# Patient Record
Sex: Male | Born: 1935 | Race: White | Hispanic: No | Marital: Married | Smoking: Former smoker
Health system: Southern US, Community
[De-identification: ages and names within clinical notes are randomized; demographics above are authoritative.]

## PROBLEM LIST (undated history)

## (undated) DIAGNOSIS — J8410 Pulmonary fibrosis, unspecified: Secondary | ICD-10-CM

## (undated) HISTORY — PX: CHOLECYSTECTOMY: SHX55

---

## 2004-08-26 ENCOUNTER — Other Ambulatory Visit: Payer: Self-pay

## 2004-08-26 ENCOUNTER — Ambulatory Visit: Payer: Self-pay | Admitting: Specialist

## 2005-05-05 ENCOUNTER — Ambulatory Visit: Payer: Self-pay | Admitting: Specialist

## 2005-05-05 ENCOUNTER — Inpatient Hospital Stay: Payer: Self-pay | Admitting: Surgery

## 2005-05-07 ENCOUNTER — Other Ambulatory Visit: Payer: Self-pay

## 2011-07-19 ENCOUNTER — Ambulatory Visit: Payer: Self-pay | Admitting: Specialist

## 2012-04-12 ENCOUNTER — Ambulatory Visit: Payer: Self-pay | Admitting: Specialist

## 2015-02-03 ENCOUNTER — Other Ambulatory Visit: Payer: Self-pay | Admitting: Otolaryngology

## 2015-02-03 DIAGNOSIS — R1310 Dysphagia, unspecified: Secondary | ICD-10-CM

## 2015-02-06 ENCOUNTER — Ambulatory Visit
Admission: RE | Admit: 2015-02-06 | Discharge: 2015-02-06 | Disposition: A | Discharge status: Home / Self Care | Payer: Medicare Other | Source: Ambulatory Visit | Attending: Otolaryngology | Admitting: Otolaryngology

## 2015-02-06 DIAGNOSIS — R1310 Dysphagia, unspecified: Secondary | ICD-10-CM

## 2015-02-06 NOTE — Therapy (Signed)
[facility] DIAGNOSTIC RADIOLOGY [address] [place], [place], [address] Phone: [phone]   Fax:     Modified Barium Swallow  Patient Details  Name: Johnny [name] MRN: [id number] Date of Birth: [birth year] Referring Provider:  Bud Face, MD  Encounter Date: 02/06/2015   Subjective: Patient behavior: (alertness, ability to follow instructions, etc.): alert; conversive w/ SLP though severely [employer] and this may be impeding his verbal communication/interaction w/ others. Wife stated pt may have some Cognitive change/decline as well. He followed general instruction w/ min. Verbal cues.  Chief complaint: pt c/o difficulty when eating some foods; he described "particles in my throat that feel like they get stuck" but that he is able to clear this "feeling" when he alternates w/ another food/liquid. Wife described s/s of Reflux and stated that pt has been prescribed a PPI to take at night(he takes this inconsistently per wife). Wife stated the [facility] is elevated at home and that pt does not eat b/f going to bed.    Objective:  Radiological Procedure: A videoflouroscopic evaluation of oral-preparatory, reflex initiation, and pharyngeal phases of the swallow was performed; as well as a screening of the upper esophageal phase.  I. POSTURE: upright II. VIEW: lateral III. COMPENSATORY STRATEGIES: f/u, dry swallow(independent) IV. BOLUSES ADMINISTERED:  Thin Liquid: 4 via straw  Nectar-thick Liquid: 1 via straw  Honey-thick Liquid: NT  Puree: 3 trials  Mechanical Soft: 2 trials V. RESULTS OF EVALUATION: A. ORAL PREPARATORY PHASE: (The lips, tongue, and velum are observed for strength and coordination)       **Overall Severity Rating: WFL. Adequate mastication and oral clearing noted; timely A-P transfer w/ all bolus consistencies.   B. SWALLOW INITIATION/REFLEX: (The reflex is normal if "triggered" by the time the bolus reached the base of the  tongue)  **Overall Severity Rating: [facility]. Timely pharyngeal swallow initiation w/ all bolus consistencies; no laryngeal penetration or aspiration noted.   C. PHARYNGEAL PHASE: (Pharyngeal function is normal if the bolus shows rapid, smooth, and continuous transit through the pharynx and there is no pharyngeal residue after the swallow)  **Overall Severity Rating: grossly WFL. Trace-min. pharyngeal residue noted inconsistently b/t trial consistencies; pt independently used a f/u, dry swallow which appeared to clear any pharyngeal residue.   D. LARYNGEAL PENETRATION: (Material entering into the laryngeal inlet/vestibule but not aspirated): None E. ASPIRATION: None F. ESOPHAGEAL PHASE: (Screening of the upper esophagus): Noted slower bolus motility w/ trial consistencies in the lower cervical-mid. Esophagus, then lower as Radiologist scanned lower. Radiologist suggested possible Presbyesophagus. This could be related to pt's and wife's description of his complaints at home and his s/s of Reflux. The Esophageal residue appeared to clear w/ time b/t trials.    ASSESSMENT: Pt appeared to present w/ a grossly WFL oropharyngeal swallow function. Pt exhibited a timely pharyngeal swallow initiation w/ all boluses consistencies and no laryngeal penetration or aspiration noted. Pt exhibited an appropriate oral phase w/ timely A-P transfer and swallow/clearing. During the pharyngeal phase, he exhibited trace-min. pharyngeal residue intermittently b/t trial consistencies, however, this did not increase or build up as trials continued. Pt independently swallowed w/ a f/u, dry swallow b/t trials which appeared to clear any pharyngeal residue. Suspect this trace-min. pharyngeal residue could be related to the Esophageal phase dysmotility noted during this study. Slower bolus motility w/ trial consistencies in the lower cervical-mid. Esophagus, then lower as Radiologist scanned lower. Radiologist suggested possible  Presbyesophagus. This could be related to pt's and wife's  description of his complaints at home and his s/s of Reflux. The Esophageal residue appeared to clear w/ time b/t trials.  PLAN/RECOMMENDATIONS:  A. Diet: mech soft-regular(meats cut small; moistened foods)  B. Swallowing Precautions: general aspiration precautions; Reflux precautions  C. Recommended consultation to GI for continued f/u and education of Reflux/PPI rec'd/Esophageal dysmotility  D. Therapy recommendations: none at this time  E. Results and recommendations were discussed w/ pt and wife; video reviewed and instructions/education given on precautions.        End of Session - 16-Feb-2015 1436    Visit Number 1   Number of Visits 1   Date for SLP Re-Evaluation 02/16/15   SLP Start Time 1300   SLP Stop Time  1400   SLP Time Calculation (min) 60 min   Activity Tolerance Patient tolerated treatment well      No past medical history on file.  No past surgical history on file.  There were no vitals filed for this visit.  Visit Diagnosis: Dysphagia - Plan: DG OP Swallowing Func-Medicare/Speech Path, DG OP Swallowing Func-Medicare/Speech Path                               G-Codes - February 16, 2015 1450    Functional Assessment Tool Used clinical judgement   Functional Limitations Swallowing   Swallow Current Status (Z6109) At least 1 percent but less than 20 percent impaired, limited or restricted   Swallow Goal Status (U0454) At least 1 percent but less than 20 percent impaired, limited or restricted   Swallow Discharge Status [phone]) At least 1 percent but less than 20 percent impaired, limited or restricted          Problem List There are no active problems to display for this patient.  Jerilynn Som, MS, CCC-SLP Rosario Duey 02/16/2015, 2:51 PM  [facility] DIAGNOSTIC RADIOLOGY [address] [place], [place], [address] Phone: [phone]    Fax:

## 2016-01-24 ENCOUNTER — Emergency Department: Payer: Medicare Other

## 2016-01-24 ENCOUNTER — Emergency Department
Admission: EM | Admit: 2016-01-24 | Discharge: 2016-01-24 | Disposition: A | Discharge status: Home / Self Care | Payer: Medicare Other | Source: Home / Self Care | Attending: Emergency Medicine | Admitting: Emergency Medicine

## 2016-01-24 ENCOUNTER — Encounter: Payer: Self-pay | Admitting: Emergency Medicine

## 2016-01-24 DIAGNOSIS — J679 Hypersensitivity pneumonitis due to unspecified organic dust: Secondary | ICD-10-CM | POA: Diagnosis present

## 2016-01-24 DIAGNOSIS — J441 Chronic obstructive pulmonary disease with (acute) exacerbation: Secondary | ICD-10-CM | POA: Diagnosis present

## 2016-01-24 DIAGNOSIS — A419 Sepsis, unspecified organism: Secondary | ICD-10-CM | POA: Diagnosis not present

## 2016-01-24 DIAGNOSIS — Z66 Do not resuscitate: Secondary | ICD-10-CM | POA: Diagnosis present

## 2016-01-24 DIAGNOSIS — J9611 Chronic respiratory failure with hypoxia: Secondary | ICD-10-CM

## 2016-01-24 DIAGNOSIS — J440 Chronic obstructive pulmonary disease with acute lower respiratory infection: Secondary | ICD-10-CM | POA: Diagnosis present

## 2016-01-24 DIAGNOSIS — S0101XA Laceration without foreign body of scalp, initial encounter: Secondary | ICD-10-CM

## 2016-01-24 DIAGNOSIS — R296 Repeated falls: Secondary | ICD-10-CM | POA: Diagnosis present

## 2016-01-24 DIAGNOSIS — Z888 Allergy status to other drugs, medicaments and biological substances status: Secondary | ICD-10-CM | POA: Diagnosis not present

## 2016-01-24 DIAGNOSIS — J209 Acute bronchitis, unspecified: Secondary | ICD-10-CM | POA: Diagnosis present

## 2016-01-24 DIAGNOSIS — I248 Other forms of acute ischemic heart disease: Secondary | ICD-10-CM | POA: Diagnosis present

## 2016-01-24 DIAGNOSIS — J9621 Acute and chronic respiratory failure with hypoxia: Secondary | ICD-10-CM | POA: Diagnosis present

## 2016-01-24 DIAGNOSIS — K279 Peptic ulcer, site unspecified, unspecified as acute or chronic, without hemorrhage or perforation: Secondary | ICD-10-CM | POA: Diagnosis present

## 2016-01-24 DIAGNOSIS — Z9049 Acquired absence of other specified parts of digestive tract: Secondary | ICD-10-CM | POA: Diagnosis not present

## 2016-01-24 DIAGNOSIS — Z7951 Long term (current) use of inhaled steroids: Secondary | ICD-10-CM | POA: Diagnosis not present

## 2016-01-24 DIAGNOSIS — E872 Acidosis: Secondary | ICD-10-CM | POA: Diagnosis present

## 2016-01-24 DIAGNOSIS — D696 Thrombocytopenia, unspecified: Secondary | ICD-10-CM | POA: Diagnosis present

## 2016-01-24 DIAGNOSIS — S0990XA Unspecified injury of head, initial encounter: Secondary | ICD-10-CM

## 2016-01-24 DIAGNOSIS — N179 Acute kidney failure, unspecified: Secondary | ICD-10-CM | POA: Diagnosis present

## 2016-01-24 DIAGNOSIS — Z87891 Personal history of nicotine dependence: Secondary | ICD-10-CM | POA: Diagnosis not present

## 2016-01-24 DIAGNOSIS — N183 Chronic kidney disease, stage 3 (moderate): Secondary | ICD-10-CM | POA: Diagnosis present

## 2016-01-24 HISTORY — DX: Pulmonary fibrosis, unspecified: J84.10

## 2016-01-24 LAB — CBC WITH DIFFERENTIAL/PLATELET
Basophils Absolute: 0 10*3/uL (ref 0–0.1)
Eosinophils Absolute: 0.1 10*3/uL (ref 0–0.7)
HEMATOCRIT: 39.9 % — AB (ref 40.0–52.0)
Hemoglobin: 13.6 g/dL (ref 13.0–18.0)
Lymphs Abs: 1.5 10*3/uL (ref 1.0–3.6)
MCH: 29.3 pg (ref 26.0–34.0)
MCHC: 34 g/dL (ref 32.0–36.0)
MCV: 86.2 fL (ref 80.0–100.0)
MONO ABS: 1.2 10*3/uL — AB (ref 0.2–1.0)
NEUTROS ABS: 4.5 10*3/uL (ref 1.4–6.5)
Neutrophils Relative %: 61 %
Platelets: 93 10*3/uL — ABNORMAL LOW (ref 150–440)
RBC: 4.63 MIL/uL (ref 4.40–5.90)
RDW: 13.4 % (ref 11.5–14.5)
WBC: 7.3 10*3/uL (ref 3.8–10.6)

## 2016-01-24 LAB — BASIC METABOLIC PANEL
ANION GAP: 10 (ref 5–15)
BUN: 30 mg/dL — AB (ref 6–20)
CALCIUM: 9.6 mg/dL (ref 8.9–10.3)
CO2: 19 mmol/L — AB (ref 22–32)
Chloride: 110 mmol/L (ref 101–111)
Creatinine, Ser: 1.56 mg/dL — ABNORMAL HIGH (ref 0.61–1.24)
GFR calc Af Amer: 43 mL/min — ABNORMAL LOW (ref >60)
GFR calc non Af Amer: 37 mL/min — ABNORMAL LOW (ref >60)
GLUCOSE: 127 mg/dL — AB (ref 65–99)
Potassium: 3.7 mmol/L (ref 3.5–5.1)
Sodium: 139 mmol/L (ref 135–145)

## 2016-01-24 MED ORDER — TETANUS-DIPHTH-ACELL PERTUSSIS 5-2.5-18.5 LF-MCG/0.5 IM SUSP
0.5000 mL | Freq: Once | INTRAMUSCULAR | Status: AC
Start: 2016-01-24 — End: 2016-01-24
  Administered 2016-01-24: 0.5 mL via INTRAMUSCULAR
  Filled 2016-01-24: qty 0.5

## 2016-01-24 MED ORDER — PREDNISONE 20 MG PO TABS: 20.0000 mg | ORAL_TABLET | Freq: Two times a day (BID) | ORAL | Status: AC

## 2016-01-24 MED ORDER — LIDOCAINE-EPINEPHRINE (PF) 1 %-1:200000 IJ SOLN
30.0000 mL | Freq: Once | INTRAMUSCULAR | Status: AC
  Administered 2016-01-24: 30 mL via INTRADERMAL
  Filled 2016-01-24: qty 30

## 2016-01-24 MED ORDER — AZITHROMYCIN 250 MG PO TABS: ORAL_TABLET | ORAL | Status: AC

## 2016-01-24 NOTE — ED Provider Notes (Signed)
[facility] Emergency Department Provider Note  ____________________________________________  Time seen: 11:45 AM  I have reviewed the triage vital signs and the nursing notes.   HISTORY  Chief Complaint Fall    HPI Johnny [name] is a 82 y.o. male brought to the ED due to fall in the bathroom with head injury and bleeding. Patient has a history of pulmonary fibrosis and is supposed to wear 2 L nasal cannula oxygen at all times. However, the patient is very resistant to using it. Today he was walking to the bathroom and he reports that he had dizzy and fell back. He hit his head sustaining a laceration. At home he is reported to have an oxygen saturation of 77%, but this appears to be on room air. He denies any productive cough or fever. No chest pain. Patient denies shortness of breath. Only reports headache in the injured area. No numbness tingling weakness or neck pain.   Past Medical History  Diagnosis Date  . Pulmonary fibrosis (HCC)      There are no active problems to display for this patient.    Past Surgical History  Procedure Laterality Date  . Cholecystectomy       Current Outpatient Rx  Name  Route  Sig  Dispense  Refill  . azithromycin (ZITHROMAX Z-PAK) 250 MG tablet      Take 2 tablets (500 mg) on  Day 1,  followed by 1 tablet (250 mg) once daily on Days 2 through 5.   6 each   0   . predniSONE (DELTASONE) 20 MG tablet   Oral   Take 1 tablet (20 mg total) by mouth 2 (two) times daily with a meal.   8 tablet   0      Allergies Review of patient's allergies indicates no known allergies.   No family history on file.  Social History Social History  Substance Use Topics  . Smoking status: Former Smoker    Types: Cigarettes  . Smokeless tobacco: None  . Alcohol Use: No    Review of Systems  Constitutional:   No fever or chills.  ENT:   No sore throat. No rhinorrhea. Cardiovascular:   No chest pain. Respiratory:    Chronic respiratory failure.. Gastrointestinal:   Negative for abdominal pain, vomiting and diarrhea.  Genitourinary:   Negative for dysuria or difficulty urinating. Musculoskeletal:   Negative for focal pain or swelling Neurological:   Positive posterior headache from trauma 10-point ROS otherwise negative.  ____________________________________________   PHYSICAL EXAM:  VITAL SIGNS: ED Triage Vitals  Enc Vitals Group     BP 01/24/16 1140 96/50 mmHg     Pulse Rate 01/24/16 1140 83     Resp 01/24/16 1138 16     Temp 01/24/16 1138 98.2 F (36.8 C)     Temp Source 01/24/16 1138 Oral     SpO2 01/24/16 1140 80 %     Weight --      Height --      Head Cir --      Peak Flow --      Pain Score --      Pain Loc --      Pain Edu? --      Excl. in GC? --     Vital signs reviewed, nursing assessments reviewed.   Constitutional:   Alert and oriented. Well appearing and in no distress. Eyes:   No scleral icterus. No conjunctival pallor. PERRL. EOMI.  No nystagmus.  ENT   Head:   Normocephalic 5 cm linear laceration on the right posterior parietal scalp area hemostatic.Marland Kitchen   Nose:   No congestion/rhinnorhea. No septal hematoma   Mouth/Throat:   MMM, no pharyngeal erythema. No peritonsillar mass.    Neck:   No stridor. No SubQ emphysema. No meningismus. Hematological/Lymphatic/Immunilogical:   No cervical lymphadenopathy. Cardiovascular:   RRR. Symmetric bilateral radial and DP pulses.  No murmurs.  Respiratory:   Normal respiratory effort without tachypnea nor retractions. Diffuse Velcro like crackles. Saturation 92% on 2 L nasal cannula. Gastrointestinal:   Soft and nontender. Non distended. There is no CVA tenderness.  No rebound, rigidity, or guarding. Genitourinary:   deferred Musculoskeletal:   Nontender with normal range of motion in all extremities. No joint effusions.  No lower extremity tenderness.  No edema. Neurologic:   Normal speech and language.  CN 2-10  normal. Motor grossly intact. No gross focal neurologic deficits are appreciated.  Skin:    Skin is warm, dry and intact. No rash noted.  No petechiae, purpura, or bullae. Laceration as above  ____________________________________________    LABS (pertinent positives/negatives) (all labs ordered are listed, but only abnormal results are displayed) Labs Reviewed  BASIC METABOLIC PANEL - Abnormal; Notable for the following:    CO2 19 (*)    Glucose, Bld 127 (*)    BUN 30 (*)    Creatinine, Ser 1.56 (*)    GFR calc non Af Amer 37 (*)    GFR calc Af Amer 43 (*)    All other components within normal limits  CBC WITH DIFFERENTIAL/PLATELET - Abnormal; Notable for the following:    HCT 39.9 (*)    Platelets 93 (*)    Monocytes Absolute 1.2 (*)    All other components within normal limits   ____________________________________________   EKG  Interpreted by me Sinus rhythm rate of 81, normal axis and intervals. Normal QRS ST segments and T waves. Voltage criteria for LVH in the high lateral leads.  ____________________________________________    RADIOLOGY  Chest x-ray shows chronic disease consistent with pulmonary fibrosis, no acute infiltrate. CT head unremarkable CT cervical spine unremarkable  ____________________________________________   PROCEDURES LACERATION REPAIR Performed by: Scotty Court, Seylah Wernert Authorized by: Sharman Cheek Consent: Verbal consent obtained. Risks and benefits: risks, benefits and alternatives were discussed Consent given by: patient Patient identity confirmed: provided demographic data Prepped and Draped in normal sterile fashion Wound explored  Laceration Location: Right posterior parietal scalp  Laceration Length: 5cm  No Foreign Bodies seen or palpated  Anesthesia: local infiltration  Local anesthetic: lidocaine 1% with epinephrine  Anesthetic total: 2 ml  Irrigation method: syringe Amount of cleaning: standard  Skin closure:  Staples   Number of sutures: 4  Technique: Staple gun   Patient tolerance: Patient tolerated the procedure well with no immediate complications.   ____________________________________________   INITIAL IMPRESSION / ASSESSMENT AND PLAN / ED COURSE  Pertinent labs & imaging results that were available during my care of the patient were reviewed by me and considered in my medical decision making (see chart for details).  Patient well appearing no acute distress. Labs at baseline, baseline creatinine is 1.9 according to electronic medical record. Vital signs unremarkable. On reassessment, patient is breathing very comfortably and oxygen saturation is 96-97% on his baseline 2 L nasal cannula. Wound was repaired after being cleansed. Imaging negative. All started patient on steroids and azithromycin and encouraged close follow-up with primary care. Return precautions given. No evidence of  pneumothorax or acute pneumonia. No sepsis. Suitable for outpatient follow-up.     ____________________________________________   FINAL CLINICAL IMPRESSION(S) / ED DIAGNOSES  Final diagnoses:  Scalp laceration, initial encounter  Head trauma, initial encounter  Chronic respiratory failure with hypoxia (HCC)       Portions of this note were generated with dragon dictation software. Dictation errors may occur despite best attempts at proofreading.   Sharman Cheek, MD 01/24/16 [phone]

## 2016-01-24 NOTE — Discharge Instructions (Signed)
Staples should be removed in 1 week.    Concussion, Adult A concussion, or closed-head injury, is a brain injury caused by a direct blow to the head or by a quick and sudden movement (jolt) of the head or neck. Concussions are usually not life-threatening. Even so, the effects of a concussion can be serious. If you have had a concussion before, you are more likely to experience concussion-like symptoms after a direct blow to the head.  CAUSES  Direct blow to the head, such as from running into another player during a soccer game, being hit in a fight, or hitting your head on a hard surface.  A jolt of the head or neck that causes the brain to move back and forth inside the skull, such as in a car crash. SIGNS AND SYMPTOMS The signs of a concussion can be hard to notice. Early on, they may be missed by you, family members, and health care providers. You may look fine but act or feel differently. Symptoms are usually temporary, but they may last for days, weeks, or even longer. Some symptoms may appear right away while others may not show up for hours or days. Every head injury is different. Symptoms include:  Mild to moderate headaches that will not go away.  A feeling of pressure inside your head.  Having more trouble than usual:  Learning or remembering things you have heard.  Answering questions.  Paying attention or concentrating.  Organizing daily tasks.  Making decisions and solving problems.  Slowness in thinking, acting or reacting, speaking, or reading.  Getting lost or being easily confused.  Feeling tired all the time or lacking energy (fatigued).  Feeling drowsy.  Sleep disturbances.  Sleeping more than usual.  Sleeping less than usual.  Trouble falling asleep.  Trouble sleeping (insomnia).  Loss of balance or feeling lightheaded or dizzy.  Nausea or vomiting.  Numbness or tingling.  Increased sensitivity  to:  Sounds.  Lights.  Distractions.  Vision problems or eyes that tire easily.  Diminished sense of taste or smell.  Ringing in the ears.  Mood changes such as feeling sad or anxious.  Becoming easily irritated or angry for little or no reason.  Lack of motivation.  Seeing or hearing things other people do not see or hear (hallucinations). DIAGNOSIS Your health care provider can usually diagnose a concussion based on a description of your injury and symptoms. He or she will ask whether you passed out (lost consciousness) and whether you are having trouble remembering events that happened right before and during your injury. Your evaluation might include:  A brain scan to look for signs of injury to the brain. Even if the test shows no injury, you may still have a concussion.  Blood tests to be sure other problems are not present. TREATMENT  Concussions are usually treated in an emergency department, in urgent care, or at a clinic. You may need to stay in the hospital overnight for further treatment.  Tell your health care provider if you are taking any medicines, including prescription medicines, over-the-counter medicines, and natural remedies. Some medicines, such as blood thinners (anticoagulants) and aspirin, may increase the chance of complications. Also tell your health care provider whether you have had alcohol or are taking illegal drugs. This information may affect treatment.  Your health care provider will send you home with important instructions to follow.  How fast you will recover from a concussion depends on many factors. These factors include how severe  your concussion is, what part of your brain was injured, your age, and how healthy you were before the concussion.  Most people with mild injuries recover fully. Recovery can take time. In general, recovery is slower in older persons. Also, persons who have had a concussion in the past or have other medical  problems may find that it takes longer to recover from their current injury. HOME CARE INSTRUCTIONS General Instructions  Carefully follow the directions your health care provider gave you.  Only take over-the-counter or prescription medicines for pain, discomfort, or fever as directed by your health care provider.  Take only those medicines that your health care provider has approved.  Do not drink alcohol until your health care provider says you are well enough to do so. Alcohol and certain other drugs may slow your recovery and can put you at risk of further injury.  If it is harder than usual to remember things, write them down.  If you are easily distracted, try to do one thing at a time. For example, do not try to watch TV while fixing dinner.  Talk with family members or close friends when making important decisions.  Keep all follow-up appointments. Repeated evaluation of your symptoms is recommended for your recovery.  Watch your symptoms and tell others to do the same. Complications sometimes occur after a concussion. Older adults with a brain injury may have a higher risk of serious complications, such as a blood clot on the brain.  Tell your teachers, school nurse, school counselor, coach, athletic trainer, or work Production designer, theatre/television/filmmanager about your injury, symptoms, and restrictions. Tell them about what you can or cannot do. They should watch for:  Increased problems with attention or concentration.  Increased difficulty remembering or learning new information.  Increased time needed to complete tasks or assignments.  Increased irritability or decreased ability to cope with stress.  Increased symptoms.  Rest. Rest helps the brain to heal. Make sure you:  Get plenty of sleep at night. Avoid staying up late at night.  Keep the same bedtime hours on weekends and weekdays.  Rest during the day. Take daytime naps or rest breaks when you feel tired.  Limit activities that require a  lot of thought or concentration. These include:  Doing homework or job-related work.  Watching TV.  Working on the computer.  Avoid any situation where there is potential for another head injury (football, hockey, soccer, basketball, martial arts, downhill snow sports and horseback riding). Your condition will get worse every time you experience a concussion. You should avoid these activities until you are evaluated by the appropriate follow-up health care providers. Returning To Your Regular Activities You will need to return to your normal activities slowly, not all at once. You must give your body and brain enough time for recovery.  Do not return to sports or other athletic activities until your health care provider tells you it is safe to do so.  Ask your health care provider when you can drive, ride a bicycle, or operate heavy machinery. Your ability to react may be slower after a brain injury. Never do these activities if you are dizzy.  Ask your health care provider about when you can return to work or school. Preventing Another Concussion It is very important to avoid another brain injury, especially before you have recovered. In rare cases, another injury can lead to permanent brain damage, brain swelling, or death. The risk of this is greatest during the first 7-10 days  after a head injury. Avoid injuries by:  Wearing a seat belt when riding in a car.  Drinking alcohol only in moderation.  Wearing a helmet when biking, skiing, skateboarding, skating, or doing similar activities.  Avoiding activities that could lead to a second concussion, such as contact or recreational sports, until your health care provider says it is okay.  Taking safety measures in your home.  Remove clutter and tripping hazards from floors and stairways.  Use grab bars in bathrooms and handrails by stairs.  Place non-slip mats on floors and in bathtubs.  Improve lighting in dim areas. SEEK MEDICAL  CARE IF:  You have increased problems paying attention or concentrating.  You have increased difficulty remembering or learning new information.  You need more time to complete tasks or assignments than before.  You have increased irritability or decreased ability to cope with stress.  You have more symptoms than before. Seek medical care if you have any of the following symptoms for more than 2 weeks after your injury:  Lasting (chronic) headaches.  Dizziness or balance problems.  Nausea.  Vision problems.  Increased sensitivity to noise or light.  Depression or mood swings.  Anxiety or irritability.  Memory problems.  Difficulty concentrating or paying attention.  Sleep problems.  Feeling tired all the time. SEEK IMMEDIATE MEDICAL CARE IF:  You have severe or worsening headaches. These may be a sign of a blood clot in the brain.  You have weakness (even if only in one hand, leg, or part of the face).  You have numbness.  You have decreased coordination.  You vomit repeatedly.  You have increased sleepiness.  One pupil is larger than the other.  You have convulsions.  You have slurred speech.  You have increased confusion. This may be a sign of a blood clot in the brain.  You have increased restlessness, agitation, or irritability.  You are unable to recognize people or places.  You have neck pain.  It is difficult to wake you up.  You have unusual behavior changes.  You lose consciousness. MAKE SURE YOU:  Understand these instructions.  Will watch your condition.  Will get help right away if you are not doing well or get worse.   This information is not intended to replace advice given to you by your health care provider. Make sure you discuss any questions you have with your health care provider.   Document Released: 09/25/2003 Document Revised: 07/26/2014 Document Reviewed: 01/25/2013 Elsevier Interactive Patient Education 2016  [employer]  Chronic Respiratory Failure Respiratory failure is when your lungs are not working well and your breathing (respiratory) system fails. When respiratory failure occurs, it is difficult for your lungs to get enough oxygen or get rid of carbon dioxide or both. Respiratory failure can be life threatening.  Respiratory failure can be acute or chronic. Acute respiratory failure is sudden, severe, and requires emergency medical treatment. Chronic respiratory failure is less severe, happens over time, and requires ongoing treatment.  CAUSES  Any problem affecting the heart or lungs can cause respiratory failure. Some of these causes may be:  Chronic bronchitis and emphysema (COPD).  Blood clot going to the lung (pulmonary embolism).  Having water in the lungs caused by heart failure, lung injury, or infection (pulmonary edema).  Collapsed lung (pneumothorax).  Pneumonia.  Pulmonary fibrosis.  Obesity.  Asthma.  Heart failure.  Any type of trauma to the chest that can make breathing difficult.  Nerve or muscle diseases making  chest movements difficult. SYMPTOMS  Signs and symptoms of chronic respiratory failure include:  Shortness of breath (dyspnea) with or without activity.  Rapid, fast breathing (tachypnea).  Wheezing.  Fast heart rate.  Bluish color to the fingernail or toenail beds.  Confusion or drowsiness or both. DIAGNOSIS  Initial diagnosis requires a thorough history and a physical exam by your health care provider. Additional tests may include:  Chest X-ray.  CT scan of your lungs.  Ultrasound to check for blood clots.  Blood tests, such as an arterial blood gas test (ABG). This is a blood test that looks at the oxygen and carbon dioxide levels in your arterial blood.  Your vital signs will be taken. This includes your respiratory rate (how many times a minute you are breathing), oxygen saturation (this measures the oxygen level in your blood),  heart rate, and blood pressure. These numbers help your health care provider determine the next steps.  Electrocardiogram. TREATMENT  Treatment of chronic respiratory failure depends on the cause of the respiratory failure. Treatment can include the following:  Oxygen. Oxygen can be delivered through the following:  Nasal cannula. This is small tubing that goes in your nose to give you oxygen.  Face mask. A face mask covers your nose and mouth to give you oxygen.  Medicine. Different medicines can be given to help with breathing. These can include:  Nebulizers. Nebulizers deliver medicines to open the air passages (bronchodilators). These medicines help to open or relax the airways in the lungs so you can breathe better. They can also help loosen mucus from your lungs.  Diuretics. Diuretic medicines can help you breathe better by getting rid of extra fluid in your body.  Steroids. Steroid medicines can help decrease inflammation in your lungs.  Chest tube. If you have a collapsed lung (pneumothorax), a chest tube is placed to help reinflate the lung.  Noninvasive positive pressure ventilation (NPPV). This is a tight-fitting mask that goes over your nose and mouth. The mask has tubing that is attached to a machine. The machine blows air into the tubing, which helps to keep the tiny air sacs (alveoli) in your lungs open. This machine allows you to breathe on your own.  Ventilator. A ventilator is a breathing machine. When on a ventilator, a breathing tube is put into the lungs. A ventilator is used when you can no longer breathe well enough on your own. You may have low oxygen levels or high carbon dioxide (CO2) levels in your blood. When you are on a ventilator, sedation and pain medicines are given to make you sleep so your lungs can heal. HOME CARE INSTRUCTIONS  Follow your health care provider's directions about medicines and respiratory therapy.  Quit smoking if you smoke. SEEK  MEDICAL CARE IF:  You have increasing shortness of breath and are less functional than you have been.  You have increased sputum, wheezing, coughing, or loss of energy.  You are on oxygen and are requiring more. SEEK IMMEDIATE MEDICAL CARE IF:  Your shortness of breath is significantly worse.  You are unable to say more than a few words without having to catch your breath.  You are much less functional. MAKE SURE YOU:  Understand these instructions.  Will watch your condition.  Will get help right away if you are not doing well or get worse.   This information is not intended to replace advice given to you by your health care provider. Make sure you discuss any questions you  have with your health care provider.   Document Released: 07/05/2005 Document Revised: 03/26/2015 Document Reviewed: 05/03/2013 Elsevier Interactive Patient Education 2016 [employer].  Stitches, [place], or Adhesive Wound Closure Health care providers use stitches (sutures), staples, and certain glue (skin adhesives) to hold skin together while it heals (wound closure). You may need this treatment after you have surgery or if you cut your skin accidentally. These methods help your skin to heal more quickly and make it less likely that you will have a scar. A wound may take several months to heal completely. The type of wound you have determines when your wound gets closed. In most cases, the wound is closed as soon as possible (primary skin closure). Sometimes, closure is delayed so the wound can be cleaned and allowed to heal naturally. This reduces the chance of infection. Delayed closure may be needed if your wound:  Is caused by a bite.  Happened more than 6 hours ago.  Involves loss of skin or the tissues under the skin.  Has dirt or debris in it that cannot be removed.  Is infected. WHAT ARE THE DIFFERENT KINDS OF WOUND CLOSURES? There are many options for wound closure. The one that your health  care provider uses depends on how deep and how large your wound is. Adhesive Glue To use this type of glue to close a wound, your health care provider holds the edges of the wound together and paints the glue on the surface of your skin. You may need more than one layer of glue. Then the wound may be covered with a light bandage (dressing). This type of skin closure may be used for small wounds that are not deep (superficial). Using glue for wound closure is less painful than other methods. It does not require a medicine that numbs the area (local anesthetic). This method also leaves nothing to be removed. Adhesive glue is often used for children and on facial wounds. Adhesive glue cannot be used for wounds that are deep, uneven, or bleeding. It is not used inside of a wound.  Adhesive Strips These strips are made of sticky (adhesive), porous paper. They are applied across your skin edges like a regular adhesive bandage. You leave them on until they fall off. Adhesive strips may be used to close very superficial wounds. They may also be used along with sutures to improve the closure of your skin edges.  Sutures Sutures are the oldest method of wound closure. Sutures can be made from natural substances, such as silk, or from synthetic materials, such as nylon and steel. They can be made from a material that your body can break down as your wound heals (absorbable), or they can be made from a material that needs to be removed from your skin (nonabsorbable). They come in many different strengths and sizes. Your health care provider attaches the sutures to a steel needle on one end. Sutures can be passed through your skin, or through the tissues beneath your skin. Then they are tied and cut. Your skin edges may be closed in one continuous stitch or in separate stitches. Sutures are strong and can be used for all kinds of wounds. Absorbable sutures may be used to close tissues under the skin. The disadvantage  of sutures is that they may cause skin reactions that lead to infection. Nonabsorbable sutures need to be removed. Staples When surgical staples are used to close a wound, the edges of your skin on both sides of the wound  are brought close together. A staple is placed across the wound, and an instrument secures the edges together. Staples are often used to close surgical cuts (incisions). Staples are faster to use than sutures, and they cause less skin reaction. Staples need to be removed using a tool that bends the staples away from your skin. HOW DO I CARE FOR MY WOUND CLOSURE?  Take medicines only as directed by your health care provider.  If you were prescribed an antibiotic medicine for your wound, finish it all even if you start to feel better.  Use ointments or creams only as directed by your health care provider.  Wash your hands with soap and water before and after touching your wound.  Do not soak your wound in water. Do not take baths, swim, or use a hot tub until your health care provider approves.  Ask your health care provider when you can start showering. Cover your wound if directed by your health care provider.  Do not take out your own sutures or staples.  Do not pick at your wound. Picking can cause an infection.  Keep all follow-up visits as directed by your health care provider. This is important. HOW [place] WILL I HAVE MY WOUND CLOSURE?  Leave adhesive glue on your skin until the glue peels away.  Leave adhesive strips on your skin until the strips fall off.  Absorbable sutures will dissolve within several days.  Nonabsorbable sutures and staples must be removed. The location of the wound will determine how [place] they stay in. This can range from several days to a couple of weeks. WHEN SHOULD I SEEK HELP FOR MY WOUND CLOSURE? Contact your health care provider if:  You have a fever.  You have chills.  You have drainage, redness, swelling, or pain at your  wound.  There is a bad smell coming from your wound.  The skin edges of your wound start to separate after your sutures have been removed.  Your wound becomes thick, raised, and darker in color after your sutures come out (scarring).   This information is not intended to replace advice given to you by your health care provider. Make sure you discuss any questions you have with your health care provider.   Document Released: 03/30/2001 Document Revised: 07/26/2014 Document Reviewed: 12/12/2013 Elsevier Interactive Patient Education [employer]

## 2016-01-24 NOTE — ED Notes (Addendum)
Pt from home via EMS for fall. Pt was in shower and hit back of head, per EMS pt lost approx 50cc of blood, direct pressure was applied and curlex applied. Pt A&O, confused at times but this at baseline. Pt denies any LOC. VS stable.pt hx of pulm fibrosis and wears 2L [place] contin,  Pt is 77% on 2L currently, pt increased to 4L at 90%

## 2016-01-24 NOTE — ED Notes (Signed)
Pt changed and pericare done  and put into blue scrubs for d/c

## 2016-01-25 ENCOUNTER — Inpatient Hospital Stay
Admission: EM | Admit: 2016-01-25 | Discharge: 2016-02-17 | DRG: 871 | Disposition: E | Payer: Medicare Other | Attending: Internal Medicine | Admitting: Internal Medicine

## 2016-01-25 ENCOUNTER — Inpatient Hospital Stay: Payer: Medicare Other

## 2016-01-25 ENCOUNTER — Emergency Department: Payer: Medicare Other

## 2016-01-25 ENCOUNTER — Encounter: Payer: Self-pay | Admitting: Emergency Medicine

## 2016-01-25 DIAGNOSIS — I248 Other forms of acute ischemic heart disease: Secondary | ICD-10-CM | POA: Diagnosis present

## 2016-01-25 DIAGNOSIS — N183 Chronic kidney disease, stage 3 unspecified: Secondary | ICD-10-CM

## 2016-01-25 DIAGNOSIS — J679 Hypersensitivity pneumonitis due to unspecified organic dust: Secondary | ICD-10-CM | POA: Diagnosis present

## 2016-01-25 DIAGNOSIS — Z7951 Long term (current) use of inhaled steroids: Secondary | ICD-10-CM | POA: Diagnosis not present

## 2016-01-25 DIAGNOSIS — Z79899 Other long term (current) drug therapy: Secondary | ICD-10-CM | POA: Diagnosis not present

## 2016-01-25 DIAGNOSIS — Z515 Encounter for palliative care: Secondary | ICD-10-CM

## 2016-01-25 DIAGNOSIS — Z87891 Personal history of nicotine dependence: Secondary | ICD-10-CM | POA: Diagnosis not present

## 2016-01-25 DIAGNOSIS — Z9049 Acquired absence of other specified parts of digestive tract: Secondary | ICD-10-CM | POA: Diagnosis not present

## 2016-01-25 DIAGNOSIS — R531 Weakness: Secondary | ICD-10-CM

## 2016-01-25 DIAGNOSIS — A419 Sepsis, unspecified organism: Principal | ICD-10-CM | POA: Diagnosis present

## 2016-01-25 DIAGNOSIS — K279 Peptic ulcer, site unspecified, unspecified as acute or chronic, without hemorrhage or perforation: Secondary | ICD-10-CM | POA: Diagnosis present

## 2016-01-25 DIAGNOSIS — D696 Thrombocytopenia, unspecified: Secondary | ICD-10-CM | POA: Diagnosis present

## 2016-01-25 DIAGNOSIS — Z888 Allergy status to other drugs, medicaments and biological substances status: Secondary | ICD-10-CM

## 2016-01-25 DIAGNOSIS — Z66 Do not resuscitate: Secondary | ICD-10-CM

## 2016-01-25 DIAGNOSIS — N179 Acute kidney failure, unspecified: Secondary | ICD-10-CM | POA: Diagnosis present

## 2016-01-25 DIAGNOSIS — R509 Fever, unspecified: Secondary | ICD-10-CM

## 2016-01-25 DIAGNOSIS — R451 Restlessness and agitation: Secondary | ICD-10-CM

## 2016-01-25 DIAGNOSIS — R296 Repeated falls: Secondary | ICD-10-CM | POA: Diagnosis present

## 2016-01-25 DIAGNOSIS — J8410 Pulmonary fibrosis, unspecified: Secondary | ICD-10-CM | POA: Diagnosis present

## 2016-01-25 DIAGNOSIS — Z7952 Long term (current) use of systemic steroids: Secondary | ICD-10-CM

## 2016-01-25 DIAGNOSIS — J9600 Acute respiratory failure, unspecified whether with hypoxia or hypercapnia: Secondary | ICD-10-CM

## 2016-01-25 DIAGNOSIS — E872 Acidosis: Secondary | ICD-10-CM | POA: Diagnosis present

## 2016-01-25 DIAGNOSIS — J9621 Acute and chronic respiratory failure with hypoxia: Secondary | ICD-10-CM

## 2016-01-25 DIAGNOSIS — J440 Chronic obstructive pulmonary disease with acute lower respiratory infection: Secondary | ICD-10-CM | POA: Diagnosis present

## 2016-01-25 DIAGNOSIS — J209 Acute bronchitis, unspecified: Secondary | ICD-10-CM | POA: Diagnosis present

## 2016-01-25 DIAGNOSIS — J441 Chronic obstructive pulmonary disease with (acute) exacerbation: Secondary | ICD-10-CM | POA: Diagnosis present

## 2016-01-25 LAB — URINALYSIS COMPLETE WITH MICROSCOPIC (ARMC ONLY)
BACTERIA UA: NONE SEEN
Bilirubin Urine: NEGATIVE
GLUCOSE, UA: NEGATIVE mg/dL
Ketones, ur: NEGATIVE mg/dL
LEUKOCYTES UA: NEGATIVE
Nitrite: NEGATIVE
PH: 5 (ref 5.0–8.0)
Protein, ur: 30 mg/dL — AB
Specific Gravity, Urine: 1.019 (ref 1.005–1.030)

## 2016-01-25 LAB — CBC WITH DIFFERENTIAL/PLATELET
Basophils Absolute: 0 10*3/uL (ref 0–0.1)
EOS ABS: 0 10*3/uL (ref 0–0.7)
Eosinophils Relative: 0 %
HCT: 36.2 % — ABNORMAL LOW (ref 40.0–52.0)
HEMOGLOBIN: 12.5 g/dL — AB (ref 13.0–18.0)
LYMPHS ABS: 0.9 10*3/uL — AB (ref 1.0–3.6)
Lymphocytes Relative: 9 %
MCH: 29.7 pg (ref 26.0–34.0)
MCHC: 34.5 g/dL (ref 32.0–36.0)
MCV: 86.1 fL (ref 80.0–100.0)
Monocytes Absolute: 1.8 10*3/uL — ABNORMAL HIGH (ref 0.2–1.0)
Monocytes Relative: 18 %
NEUTROS ABS: 6.9 10*3/uL — AB (ref 1.4–6.5)
Platelets: 82 10*3/uL — ABNORMAL LOW (ref 150–440)
RBC: 4.21 MIL/uL — AB (ref 4.40–5.90)
RDW: 13.4 % (ref 11.5–14.5)
WBC: 9.7 10*3/uL (ref 3.8–10.6)

## 2016-01-25 LAB — COMPREHENSIVE METABOLIC PANEL
ALBUMIN: 3.3 g/dL — AB (ref 3.5–5.0)
ALK PHOS: 67 U/L (ref 38–126)
ALT: 11 U/L — AB (ref 17–63)
AST: 28 U/L (ref 15–41)
Anion gap: 7 (ref 5–15)
BUN: 30 mg/dL — AB (ref 6–20)
CALCIUM: 9.4 mg/dL (ref 8.9–10.3)
CO2: 20 mmol/L — AB (ref 22–32)
CREATININE: 1.48 mg/dL — AB (ref 0.61–1.24)
Chloride: 112 mmol/L — ABNORMAL HIGH (ref 101–111)
GFR calc Af Amer: 46 mL/min — ABNORMAL LOW (ref >60)
GFR calc non Af Amer: 40 mL/min — ABNORMAL LOW (ref >60)
Glucose, Bld: 115 mg/dL — ABNORMAL HIGH (ref 65–99)
Potassium: 4.1 mmol/L (ref 3.5–5.1)
SODIUM: 139 mmol/L (ref 135–145)
Total Bilirubin: 2.2 mg/dL — ABNORMAL HIGH (ref 0.3–1.2)
Total Protein: 6.8 g/dL (ref 6.5–8.1)

## 2016-01-25 LAB — TSH: TSH: 0.586 u[IU]/mL (ref 0.350–4.500)

## 2016-01-25 LAB — LACTIC ACID, PLASMA
LACTIC ACID, VENOUS: 1.3 mmol/L (ref 0.5–1.9)
LACTIC ACID, VENOUS: 1 mmol/L (ref 0.5–1.9)

## 2016-01-25 LAB — TROPONIN I
TROPONIN I: 0.03 ng/mL — AB (ref <0.03)
TROPONIN I: 0.04 ng/mL — AB (ref <0.03)

## 2016-01-25 LAB — EXPECTORATED SPUTUM ASSESSMENT W GRAM STAIN, RFLX TO RESP C

## 2016-01-25 LAB — EXPECTORATED SPUTUM ASSESSMENT W REFEX TO RESP CULTURE

## 2016-01-25 MED ORDER — SODIUM CHLORIDE 0.9 % IV SOLN
INTRAVENOUS | Status: DC
  Administered 2016-01-25: 16:00:00 via INTRAVENOUS

## 2016-01-25 MED ORDER — FLUOXETINE HCL 20 MG PO CAPS
20.0000 mg | ORAL_CAPSULE | Freq: Every day | ORAL | Status: DC
  Administered 2016-01-25: 18:00:00 20 mg via ORAL
  Filled 2016-01-25 (×2): qty 1

## 2016-01-25 MED ORDER — GUAIFENESIN ER 600 MG PO TB12
600.0000 mg | ORAL_TABLET | Freq: Two times a day (BID) | ORAL | Status: DC
  Administered 2016-01-25 (×2): 600 mg via ORAL
  Filled 2016-01-25 (×3): qty 1

## 2016-01-25 MED ORDER — SODIUM CHLORIDE 3 % IN NEBU
4.0000 mL | INHALATION_SOLUTION | Freq: Every day | RESPIRATORY_TRACT | Status: DC
Start: 2016-01-25 — End: 2016-01-26
  Administered 2016-01-25 – 2016-01-26 (×2): 4 mL via RESPIRATORY_TRACT
  Filled 2016-01-25 (×2): qty 4

## 2016-01-25 MED ORDER — SODIUM CHLORIDE 0.9% FLUSH
3.0000 mL | Freq: Two times a day (BID) | INTRAVENOUS | Status: DC
  Administered 2016-01-25 – 2016-01-26 (×2): 3 mL via INTRAVENOUS

## 2016-01-25 MED ORDER — ASPIRIN 81 MG PO CHEW
81.0000 mg | CHEWABLE_TABLET | Freq: Every day | ORAL | Status: DC
Start: 2016-01-25 — End: 2016-01-26
  Administered 2016-01-25: 81 mg via ORAL
  Filled 2016-01-25 (×2): qty 1

## 2016-01-25 MED ORDER — METHYLPREDNISOLONE SODIUM SUCC 125 MG IJ SOLR
60.0000 mg | INTRAMUSCULAR | Status: DC
  Administered 2016-01-25 – 2016-01-26 (×2): 60 mg via INTRAVENOUS
  Filled 2016-01-25 (×2): qty 2

## 2016-01-25 MED ORDER — SODIUM CHLORIDE 0.9 % IV BOLUS (SEPSIS)
500.0000 mL | Freq: Once | INTRAVENOUS | Status: AC
  Administered 2016-01-25: 500 mL via INTRAVENOUS

## 2016-01-25 MED ORDER — ENOXAPARIN SODIUM 30 MG/0.3ML ~~LOC~~ SOLN
30.0000 mg | SUBCUTANEOUS | Status: DC
Start: 2016-01-25 — End: 2016-01-26
  Administered 2016-01-25: 30 mg via SUBCUTANEOUS
  Filled 2016-01-25 (×2): qty 0.3

## 2016-01-25 MED ORDER — PANTOPRAZOLE SODIUM 40 MG PO TBEC
40.0000 mg | DELAYED_RELEASE_TABLET | Freq: Every day | ORAL | Status: DC
  Administered 2016-01-25: 40 mg via ORAL
  Filled 2016-01-25 (×2): qty 1

## 2016-01-25 MED ORDER — ACETAMINOPHEN 650 MG RE SUPP: 650.0000 mg | Freq: Four times a day (QID) | RECTAL | Status: DC | PRN

## 2016-01-25 MED ORDER — LEVOFLOXACIN IN D5W 750 MG/150ML IV SOLN
750.0000 mg | INTRAVENOUS | Status: DC
  Administered 2016-01-25: 750 mg via INTRAVENOUS
  Filled 2016-01-25: qty 150

## 2016-01-25 MED ORDER — BUDESONIDE 0.25 MG/2ML IN SUSP
0.2500 mg | Freq: Two times a day (BID) | RESPIRATORY_TRACT | Status: DC
  Administered 2016-01-25 – 2016-01-26 (×2): 0.25 mg via RESPIRATORY_TRACT
  Filled 2016-01-25 (×3): qty 2

## 2016-01-25 MED ORDER — POLYETHYLENE GLYCOL 3350 17 G PO PACK: 17.0000 g | PACK | Freq: Every day | ORAL | Status: DC | PRN

## 2016-01-25 MED ORDER — SODIUM CHLORIDE 0.9 % IV SOLN: 250.0000 mL | INTRAVENOUS | Status: DC | PRN

## 2016-01-25 MED ORDER — TIOTROPIUM BROMIDE MONOHYDRATE 18 MCG IN CAPS
18.0000 ug | ORAL_CAPSULE | Freq: Every day | RESPIRATORY_TRACT | Status: DC
Start: 2016-01-25 — End: 2016-01-26
  Administered 2016-01-25: 18:00:00 18 ug via RESPIRATORY_TRACT
  Filled 2016-01-25: qty 5

## 2016-01-25 MED ORDER — ACETAMINOPHEN 325 MG PO TABS: 650.0000 mg | ORAL_TABLET | Freq: Four times a day (QID) | ORAL | Status: DC | PRN

## 2016-01-25 MED ORDER — NITROGLYCERIN 2 % TD OINT
1.0000 [in_us] | TOPICAL_OINTMENT | Freq: Four times a day (QID) | TRANSDERMAL | Status: DC
  Administered 2016-01-25 – 2016-01-26 (×4): 1 [in_us] via TOPICAL
  Filled 2016-01-25 (×2): qty 1
  Filled 2016-01-25: qty 30
  Filled 2016-01-25: qty 1

## 2016-01-25 MED ORDER — SODIUM CHLORIDE 0.9% FLUSH
3.0000 mL | INTRAVENOUS | Status: DC | PRN
Start: 2016-01-25 — End: 2016-01-26

## 2016-01-25 MED ORDER — ALBUTEROL SULFATE (2.5 MG/3ML) 0.083% IN NEBU
2.5000 mg | INHALATION_SOLUTION | RESPIRATORY_TRACT | Status: DC
  Administered 2016-01-25 – 2016-01-26 (×7): 2.5 mg via RESPIRATORY_TRACT
  Filled 2016-01-25 (×8): qty 3

## 2016-01-25 MED ORDER — CETYLPYRIDINIUM CHLORIDE 0.05 % MT LIQD
7.0000 mL | Freq: Two times a day (BID) | OROMUCOSAL | Status: DC
  Administered 2016-01-25 – 2016-01-26 (×2): 7 mL via OROMUCOSAL

## 2016-01-25 MED ORDER — ONDANSETRON HCL 4 MG/2ML IJ SOLN: 4.0000 mg | Freq: Four times a day (QID) | INTRAMUSCULAR | Status: DC | PRN

## 2016-01-25 MED ORDER — ONDANSETRON HCL 4 MG PO TABS: 4.0000 mg | ORAL_TABLET | Freq: Four times a day (QID) | ORAL | Status: DC | PRN

## 2016-01-25 MED ORDER — DEXTROSE 5 % IV SOLN: 1.0000 g | Freq: Once | INTRAVENOUS | Status: DC

## 2016-01-25 MED ORDER — DOCUSATE SODIUM 100 MG PO CAPS
100.0000 mg | ORAL_CAPSULE | Freq: Two times a day (BID) | ORAL | Status: DC
  Administered 2016-01-25 (×2): 100 mg via ORAL
  Filled 2016-01-25 (×3): qty 1

## 2016-01-25 NOTE — H&P (Signed)
[facility] - [place] at [facility]   PATIENT NAME: Johnny [name]    MR#:  [id number]  DATE OF BIRTH:  12/16/1922  DATE OF ADMISSION:  01/22/2016  PRIMARY CARE PHYSICIAN: Danella Penton, MD   REQUESTING/REFERRING PHYSICIAN:   CHIEF COMPLAINT:   Chief Complaint  Patient presents with  . Altered Mental Status    HISTORY OF PRESENT ILLNESS: Johnny [name]  is a 82 y.o. male with a known history of  Chronic respiratory failure, pulmonary fibrosis, on 2 L of oxygen through nasal cannula at home, who presents to the hospital with complaints of frequent falls, generalized weakness. Apparently patient was admitted to emergency room yesterday because of numerous falls, weakness, he had CT scans done, showing no fractures, he was discharged home, however, was not able to walk. He was more confused, had rigors, shakes, was brought back to emergency room for further evaluation. Apparently, patient's oxygen level has been running low as well, in the high 70s, low 80s on his usual oxygen doses. Chest x-ray the emergency room was unremarkable. Pulmonary fibrosis. Patient's 5, however, is telling me that patient is producing cream-colored thick mucous, difficult to lift it up, patient has been choking on phlegm. He had fevers last night to 101.2. Hospitalist services were contacted for admission.   PAST MEDICAL HISTORY:   Past Medical History  Diagnosis Date  . Pulmonary fibrosis (HCC)     PAST SURGICAL HISTORY: Past Surgical History  Procedure Laterality Date  . Cholecystectomy      SOCIAL HISTORY:  Social History  Substance Use Topics  . Smoking status: Former Smoker    Types: Cigarettes  . Smokeless tobacco: Not on file  . Alcohol Use: No    FAMILY HISTORY: History reviewed. No pertinent family history.  DRUG ALLERGIES:  Allergies  Allergen Reactions  . Iodine Hives    Review of Systems  Unable to perform ROS: dementia    MEDICATIONS AT HOME:  Prior to  Admission medications   Medication Sig Start Date End Date Taking? Authorizing Provider  azithromycin (ZITHROMAX Z-PAK) 250 MG tablet Take 2 tablets (500 mg) on  Day 1,  followed by 1 tablet (250 mg) once daily on Days 2 through 5. 01/24/16  Yes Sharman Cheek, MD  FLUoxetine (PROZAC) 20 MG capsule Take 20 mg by mouth daily. 11/20/15  Yes Historical Provider, MD  fluticasone (FLONASE) 50 MCG/ACT nasal spray Place 1 spray into the nose daily. 06/26/15  Yes Historical Provider, MD  loratadine (CLARITIN) 10 MG tablet Take 10 mg by mouth daily.   Yes Historical Provider, MD  omeprazole (PRILOSEC) 40 MG capsule Take 40 mg by mouth every morning. 01/11/16  Yes Historical Provider, MD  polyethylene glycol (MIRALAX / GLYCOLAX) packet Take 8.5 g by mouth daily as needed. For constipation.   Yes Historical Provider, MD  predniSONE (DELTASONE) 20 MG tablet Take 1 tablet (20 mg total) by mouth 2 (two) times daily with a meal. 01/24/16  Yes Sharman Cheek, MD  QUEtiapine (SEROQUEL) 50 MG tablet Take 25 mg by mouth at bedtime as needed.   Yes Historical Provider, MD  spironolactone (ALDACTONE) 25 MG tablet Take 12.5 mg by mouth daily. 12/18/15  Yes Historical Provider, MD      PHYSICAL EXAMINATION:   VITAL SIGNS: Blood pressure 114/59, pulse 81, temperature 98.2 F (36.8 C), temperature source Oral, resp. rate 22, height 5\' 10"  (1.778 m), weight 68.7 kg (151 lb 7.3 oz), SpO2 91 %.  GENERAL:  82  y.o.-year-old patient lying in the bed with no acute distress, But living by himself, intermittently laughing, talking, but his speech does not make any sense.  EYES: Pupils equal, round, reactive to light and accommodation. No scleral icterus. Extraocular muscles intact.  HEENT: Head atraumatic, normocephalic. Oropharynx and nasopharynx clear.  NECK:  Supple, no jugular venous distention. No thyroid enlargement, no tenderness.  LUNGS: Normal breath sounds bilaterally, no wheezing, rales,rhonchi , bilateral crepitations in  lower lung fields anteriorly as well as posteriorly. No use of accessory muscles of respiration.  CARDIOVASCULAR: S1, S2 normal. No murmurs, rubs, or gallops.  ABDOMEN: Soft, nontender, nondistended. Bowel sounds present. No organomegaly or mass.  EXTREMITIES: No pedal edema, cyanosis, or clubbing.  NEUROLOGIC: Cranial nerves II through XII are intact. Muscle strength 5/5 in all extremities. Sensation intact. Gait not checked.  PSYCHIATRIC: The patient is alert , not oriented, also has slurry speech, dementia  SKIN: No obvious rash, lesion, or ulcer.   LABORATORY PANEL:   CBC  Recent Labs Lab 01/24/16 1140 2016/04/02 1113  WBC 7.3 9.7  HGB 13.6 12.5*  HCT 39.9* 36.2*  PLT 93* 82*  MCV 86.2 86.1  MCH 29.3 29.7  MCHC 34.0 34.5  RDW 13.4 13.4  LYMPHSABS 1.5 0.9*  MONOABS 1.2* 1.8*  EOSABS 0.1 0.0  BASOSABS 0.0 0.0   ------------------------------------------------------------------------------------------------------------------  Chemistries   Recent Labs Lab 2016/04/02 1113  NA 139  K 4.1  CL 112*  CO2 20*  GLUCOSE 115*  BUN 30*  CREATININE 1.48*  CALCIUM 9.4  AST 28  ALT 11*  ALKPHOS 67  BILITOT 2.2*   ------------------------------------------------------------------------------------------------------------------  Cardiac Enzymes No results for input(s): TROPONINI in the last 168 hours. ------------------------------------------------------------------------------------------------------------------  RADIOLOGY: Dg Chest 2 View  01/24/2016  CLINICAL DATA:  Pt to ED from home via EMS post fall today. Pt was in shower and hit back of head. Cough, h/o pulmonary fibrosis. EXAM: CHEST  2 VIEW COMPARISON:  None. FINDINGS: Normal cardiac silhouette. There is volume loss in the RIGHT hemi thorax. There is a fine interstitial pattern in the lungs. No new focal consolidation. No pneumothorax. IMPRESSION: 1. Fine interstitial pattern suggests chronic interstitial lung  disease. 2. Volume loss the RIGHT hemithorax. 3. No clear acute findings. No comparison available at time of interpretation. Electronically Signed   By: Genevive BiStewart  Edmunds M.D.   On: 01/24/2016 12:34   Ct Head Wo Contrast  02/07/2016  CLINICAL DATA:  Fall EXAM: CT HEAD WITHOUT CONTRAST TECHNIQUE: Contiguous axial images were obtained from the base of the skull through the vertex without intravenous contrast. COMPARISON:  None. FINDINGS: No mass effect, midline shift, or acute hemorrhage. Mild global atrophy appropriate to age is present. Mild chronic ischemic changes in the periventricular white matter. No skull fracture. Soft tissue injury over the right parietal region is noted. IMPRESSION: No acute intracranial pathology. Electronically Signed   By: Jolaine ClickArthur  Hoss M.D.   On: 04/16/16 11:55   Ct Head Wo Contrast  01/24/2016  CLINICAL DATA:  Pain following fall EXAM: CT HEAD WITHOUT CONTRAST CT CERVICAL SPINE WITHOUT CONTRAST TECHNIQUE: Multidetector CT imaging of the head and cervical spine was performed following the standard protocol without intravenous contrast. Multiplanar CT image reconstructions of the cervical spine were also generated. COMPARISON:  None. FINDINGS: CT HEAD FINDINGS There is mild diffuse atrophy. There is no intracranial mass, hemorrhage, extra-axial fluid collection, or midline shift. There is patchy small vessel disease in the centra semiovale bilaterally. No acute infarct is evident. There are  foci of calcification in the cavernous carotid arteries bilaterally. The bony calvarium appears intact. There is a right parietal scalp hematoma superiorly. The mastoid air cells are clear. There is mucosal thickening in the left maxillary antrum. Other visualized paranasal sinuses are clear. No intraorbital lesions are evident. CT CERVICAL SPINE FINDINGS There is no fracture or spondylolisthesis. Prevertebral soft tissues and predental space regions are normal. There is moderately severe disc  space narrowing at C5-6 and C6-7. There is moderate disc space narrowing at C3-4 and C4-5. There is multilevel facet hypertrophy. There is exit foraminal narrowing due to bony hypertrophy at C3-4 on the right, at C5-6 on the right, and at C6-7 bilaterally, more severe on the left than on the right. There are foci of carotid artery calcification bilaterally. There is extensive pulmonary fibrotic change in the lung apices. There is focal nuchal calcification in the mid cervical spine posteriorly. IMPRESSION: CT head: Mild diffuse atrophy with patchy periventricular small vessel disease. No intracranial mass, hemorrhage, or extra-axial fluid collection. No acute infarct. Foci of vascular calcification. Left maxillary sinus disease. Right parietal scalp hematoma without underlying fracture. CT cervical spine: No fracture or spondylolisthesis. Multilevel arthropathy. Carotid artery calcification bilaterally. Apical pulmonary fibrosis noted. Electronically Signed   By: Bretta Bang III M.D.   On: 01/24/2016 12:32   Ct Cervical Spine Wo Contrast  01/24/2016  CLINICAL DATA:  Pain following fall EXAM: CT HEAD WITHOUT CONTRAST CT CERVICAL SPINE WITHOUT CONTRAST TECHNIQUE: Multidetector CT imaging of the head and cervical spine was performed following the standard protocol without intravenous contrast. Multiplanar CT image reconstructions of the cervical spine were also generated. COMPARISON:  None. FINDINGS: CT HEAD FINDINGS There is mild diffuse atrophy. There is no intracranial mass, hemorrhage, extra-axial fluid collection, or midline shift. There is patchy small vessel disease in the centra semiovale bilaterally. No acute infarct is evident. There are foci of calcification in the cavernous carotid arteries bilaterally. The bony calvarium appears intact. There is a right parietal scalp hematoma superiorly. The mastoid air cells are clear. There is mucosal thickening in the left maxillary antrum. Other visualized  paranasal sinuses are clear. No intraorbital lesions are evident. CT CERVICAL SPINE FINDINGS There is no fracture or spondylolisthesis. Prevertebral soft tissues and predental space regions are normal. There is moderately severe disc space narrowing at C5-6 and C6-7. There is moderate disc space narrowing at C3-4 and C4-5. There is multilevel facet hypertrophy. There is exit foraminal narrowing due to bony hypertrophy at C3-4 on the right, at C5-6 on the right, and at C6-7 bilaterally, more severe on the left than on the right. There are foci of carotid artery calcification bilaterally. There is extensive pulmonary fibrotic change in the lung apices. There is focal nuchal calcification in the mid cervical spine posteriorly. IMPRESSION: CT head: Mild diffuse atrophy with patchy periventricular small vessel disease. No intracranial mass, hemorrhage, or extra-axial fluid collection. No acute infarct. Foci of vascular calcification. Left maxillary sinus disease. Right parietal scalp hematoma without underlying fracture. CT cervical spine: No fracture or spondylolisthesis. Multilevel arthropathy. Carotid artery calcification bilaterally. Apical pulmonary fibrosis noted. Electronically Signed   By: Bretta Bang III M.D.   On: 01/24/2016 12:32   Dg Chest Port 1 View  02-10-16  CLINICAL DATA:  Altered mental status.  Recent head trauma EXAM: PORTABLE CHEST 1 VIEW COMPARISON:  January 24, 2016 and July 18, 2011 FINDINGS: There is extensive interstitial fibrosis throughout the right lung with volume loss. Fibrotic change to a lesser  degree is noted on the left. There is no new opacity. There is no frank edema or consolidation. Heart size and pulmonary vascularity are normal. Aorta is tortuous but stable. There is no evident adenopathy. Bones are osteoporotic. IMPRESSION: Stable fibrotic change in the lungs, more severe on the right than on the left. Volume loss noted on the right, stable. No new opacity. Stable  cardiac silhouette. Electronically Signed   By: Bretta Bang III M.D.   On: 01/18/2016 11:28    EKG: Orders placed or performed during the hospital encounter of 01/24/16  . EKG 12-Lead  . EKG 12-Lead  EKG is not done  IMPRESSION AND PLAN:  Principal Problem:   Fever Active Problems:   Acute on chronic respiratory failure with hypoxia (HCC)   Generalized weakness   Acute bronchitis   Thrombocytopenia (HCC)   Chronic renal insufficiency, stage III (moderate) #1. Fever, concerning for sepsis, patient's family reported thick cream-colored phlegm, shortness of breath, I suspect patient has COPD exacerbation due to acute bronchitis, admit patient to medical floor, get blood cultures, sputum cultures if possible, initiate him on levofloxacin #2. Acute on chronic respiratory failure with hypoxia, admit patient to medical floor, start him on steroids intravenously, nebulizers, oxygen, get CT scan of the chest without contrast, get pulmonary involved for further recommendations #3. Acute bronchitis, antibiotics, get sputum cultures, adjust antibiotics depending on culture results #4. Generalized weakness, get physical therapist, occupational therapist evaluation, possible rehabilitation placement if patient is able to follow commands #5. Thrombocytopenia, chronic, follow closely #6 chronic renal insufficiency, stage III, follow with therapy   All the records are reviewed and case discussed with ED provider. Management plans discussed with the patient, family and they are in agreement.  CODE STATUS: Code Status History    This patient does not have a recorded code status. Please follow your organizational policy for patients in this situation.    Advance Directive Documentation        Most Recent Value   Type of Advance Directive  Living will   Pre-existing out of facility DNR order (yellow form or pink MOST form)     "MOST" Form in Place?         TOTAL TIME TAKING CARE OF THIS  PATIENT: 50 minutes.    Katharina Caper M.D on 02/02/2016 at 1:51 PM  Between 7am to 6pm - Pager - [phone] After 6pm go to [email or web address] - password [email or web address] [facility]  [place]  [facility]  Office  [phone]  CC: Primary care physician; Danella Penton, MD

## 2016-01-25 NOTE — ED Notes (Signed)
Pt arrived by EMS after family called stating that he had AMS and that he had been running a "fever off/on" all night. Pt was seen in the ED yesterday after falling in the shower and hitting the back of his head. Pt received sutures before discharge. Family states the highest temp they obtained was 101.2, EMS obtained a temp of 100.3 axillary and administered 975 of tylenol and upon arrival the Pt's temp was 98.2 oral.

## 2016-01-25 NOTE — Consult Note (Signed)
Pharmacy Antibiotic Note  Johnny [name] is a 82 y.o. male admitted on 02/02/2016 with pneumonia.  Pharmacy has been consulted for levofloxacin dosing.  Plan: levofloxacin 750mg  q 48 hours  Height: 5\' 10"  (177.8 cm) Weight: 151 lb 7.3 oz (68.7 kg) IBW/kg (Calculated) : 73  Temp (24hrs), Avg:98.2 F (36.8 C), Min:98.2 F (36.8 C), Max:98.2 F (36.8 C)   Recent Labs Lab 01/24/16 1140 2016-01-08 1113  WBC 7.3 9.7  CREATININE 1.56* 1.48*  LATICACIDVEN  --  1.3    Estimated Creatinine Clearance: 31.6 mL/min (by C-G formula based on Cr of 1.48).    Allergies  Allergen Reactions  . Iodine Hives    Antimicrobials this admission: levofloxacin 7/9 >>    Dose adjustments this admission:   Microbiology results: 7/9 BCx: pending 7/9 UCx: pending    Thank you for allowing pharmacy to be a part of this patient's care.  Olene FlossMelissa D Amabel Stmarie, Pharm.D Clinical Pharmacist   01/26/2016 1:52 PM

## 2016-01-25 NOTE — ED Notes (Signed)
Patient transported to CT 

## 2016-01-25 NOTE — Progress Notes (Deleted)
Pt has been discharged home. Discharge papers giving and explained to pt. F/U appointment and meds reviewed with pt. Pt to pick up RX from pharmacy and to scheduled the f/u appointment. Pt is aware. Pt declined to be escorted on a wheelchair.  

## 2016-01-25 NOTE — ED Provider Notes (Signed)
Time Seen: Approximately 1040 I have reviewed the triage notes  Chief Complaint: Altered Mental Status   History of Present Illness: SHANDY VI is a 82 y.o. male *who was here yesterday and was evaluated for a fall with acute closed head injury. Patient's head CT was negative and the patient had repair of his laceration. He has a history of pulmonary fibrosis and is on chronic home oxygen therapy which she occasionally is been noncompliant. His wife states he had difficulty ambulating after his fall and had fallen the previous day prior to yesterday's evaluation. She states he's been increasingly confused and they noticed a low-grade fever at home. Should also is identified to have a low-grade fever by EMS with 100.3 axillary he was given Tylenol prior to arrival. His wife states that he's had some increased confusion and difficulty with awareness over the last 24 hours. Patient himself denies any discomfort at this time, increased shortness of breath, productive cough or wheezing, urinary symptoms, etc. His wife describes an episode where the patient was shaking in the bathroom which sounded similar to rigors  Past Medical History  Diagnosis Date  . Pulmonary fibrosis (HCC)     There are no active problems to display for this patient.   Past Surgical History  Procedure Laterality Date  . Cholecystectomy      Past Surgical History  Procedure Laterality Date  . Cholecystectomy      Current Outpatient Rx  Name  Route  Sig  Dispense  Refill  . azithromycin (ZITHROMAX Z-PAK) 250 MG tablet      Take 2 tablets (500 mg) on  Day 1,  followed by 1 tablet (250 mg) once daily on Days 2 through 5.   6 each   0   . predniSONE (DELTASONE) 20 MG tablet   Oral   Take 1 tablet (20 mg total) by mouth 2 (two) times daily with a meal.   8 tablet   0     Allergies:  Review of patient's allergies indicates no known allergies.  Family History: History reviewed. No pertinent family  history.  Social History: Social History  Substance Use Topics  . Smoking status: Former Smoker    Types: Cigarettes  . Smokeless tobacco: None  . Alcohol Use: No     Review of Systems:   10 point review of systems was performed and was otherwise negative:  Constitutional:Low-grade fever  Eyes: No visual disturbances ENT: No sore throat, ear pain Cardiac: No chest pain Respiratory: No shortness of breath, wheezing, or stridor Abdomen: No abdominal pain, no vomiting, No diarrhea Endocrine: No weight loss, No night sweats Extremities: No peripheral edema, cyanosis Skin: No rashes, easy bruising Neurologic: No focal weakness, trouble with speech or swollowing Urologic: No dysuria, Hematuria, or urinary frequency   Physical Exam:  ED Triage Vitals  Enc Vitals Group     BP Feb 24, 2016 1045 114/59 mmHg     Pulse Rate 02/24/2016 1045 81     Resp 02/24/16 1045 22     Temp 2016/02/24 1045 98.2 F (36.8 C)     Temp Source 2016/02/24 1045 Oral     SpO2 02-24-2016 1045 91 %     Weight 02/24/2016 1045 151 lb 7.3 oz (68.7 kg)     Height 2016/02/24 1045 5\' 10"  (1.778 m)     Head Cir --      Peak Flow --      Pain Score --      Pain Loc --  Pain Edu? --      Excl. in GC? --     General: Awake , Alert , And oriented 2. Patient was not aware of the date and usually will be able to say what year it is, etc. He does follow commands and is a Glasgow Coma Scale 15  Head: Normal cephalic and he has a repaired laceration on the upper occipital region without crepitus or step-off noted and no significant erythema or drainage around the stapled wound Eyes: Pupils equal , round, reactive to light. Conjunctiva are clear Nose/Throat: No nasal drainage, patent upper airway without erythema or exudate.  Neck: Supple, Full range of motion, No anterior adenopathy or palpable thyroid masses Lungs: Coarse breath sounds auscultated anteriorly over the left base without wheezes or rales Heart: Regular rate,  regular rhythm without murmurs , gallops , or rubs Abdomen: Soft, non tender without rebound, guarding , or rigidity; bowel sounds positive and symmetric in all 4 quadrants. No organomegaly .        Extremities: 2 plus symmetric pulses. No edema, clubbing or cyanosis Neurologic: normal ambulation, Motor symmetric without deficits, sensory intact Skin: warm, dry, no rashes   Labs:   All laboratory work was reviewed including any pertinent negatives or positives listed below:  Labs Reviewed  CBC WITH DIFFERENTIAL/PLATELET - Abnormal; Notable for the following:    RBC 4.21 (*)    Hemoglobin 12.5 (*)    HCT 36.2 (*)    Platelets 82 (*)    Neutro Abs 6.9 (*)    Lymphs Abs 0.9 (*)    Monocytes Absolute 1.8 (*)    All other components within normal limits  COMPREHENSIVE METABOLIC PANEL - Abnormal; Notable for the following:    Chloride 112 (*)    CO2 20 (*)    Glucose, Bld 115 (*)    BUN 30 (*)    Creatinine, Ser 1.48 (*)    Albumin 3.3 (*)    ALT 11 (*)    Total Bilirubin 2.2 (*)    GFR calc non Af Amer 40 (*)    GFR calc Af Amer 46 (*)    All other components within normal limits  URINALYSIS COMPLETEWITH MICROSCOPIC ([facility] ONLY) - Abnormal; Notable for the following:    Color, Urine YELLOW (*)    APPearance CLEAR (*)    Hgb urine dipstick 1+ (*)    Protein, ur 30 (*)    Squamous Epithelial / LPF 0-5 (*)    All other components within normal limits  URINE CULTURE  CULTURE, BLOOD (ROUTINE X 2)  CULTURE, BLOOD (ROUTINE X 2)  LACTIC ACID, PLASMA  LACTIC ACID, PLASMA  Blood cultures and urine culture are pending.  Radiology:     CT HEAD WO CONTRAST (Final result) Result time: 08-Feb-2016 11:55:42   Final result by Rad Results In Interface (2016/02/08 11:55:42)   Narrative:   CLINICAL DATA: Fall  EXAM: CT HEAD WITHOUT CONTRAST  TECHNIQUE: Contiguous axial images were obtained from the base of the skull through the vertex without intravenous contrast.  COMPARISON:  None.  FINDINGS: No mass effect, midline shift, or acute hemorrhage. Mild global atrophy appropriate to age is present. Mild chronic ischemic changes in the periventricular white matter. No skull fracture. Soft tissue injury over the right parietal region is noted.  IMPRESSION: No acute intracranial pathology.   Electronically Signed By: Jolaine Click M.D. On: 2016-02-08 11:55          DG Chest Port 1 View (Final result)  Result time: 05/12/16 11:28:58   Final result by Rad Results In Interface (05/12/16 11:28:58)   Narrative:   CLINICAL DATA: Altered mental status. Recent head trauma  EXAM: PORTABLE CHEST 1 VIEW  COMPARISON: January 24, 2016 and July 18, 2011  FINDINGS: There is extensive interstitial fibrosis throughout the right lung with volume loss. Fibrotic change to a lesser degree is noted on the left. There is no new opacity. There is no frank edema or consolidation. Heart size and pulmonary vascularity are normal. Aorta is tortuous but stable. There is no evident adenopathy. Bones are osteoporotic.  IMPRESSION: Stable fibrotic change in the lungs, more severe on the right than on the left. Volume loss noted on the right, stable. No new opacity. Stable cardiac silhouette.   Electronically Signed By: Bretta BangWilliam Woodruff III M.D. On: 17-Aug-2015 11:28       I personally reviewed the radiologic studies    ED Course:  Patient's stay here was uneventful and after he was pancultured I started him on IV Rocephin. I felt the patient may have community-acquired pneumonia based on physical exam and objective findings on his chest x-ray does not show any acute infiltrate. The patient's urine appears normal and his wound does not appear to be infected at this time. Patient was started on IV fluids along with the IV antibiotic therapy.    Assessment:  Fever Altered mental status   Final Clinical Impression:   Final diagnoses:  Fever,  unspecified fever cause     Plan:  Inpatient            Jennye MoccasinBrian S Karrissa Parchment, MD 05/12/16 1335

## 2016-01-26 ENCOUNTER — Inpatient Hospital Stay
Admit: 2016-01-26 | Discharge: 2016-01-26 | Disposition: A | Discharge status: Home / Self Care | Payer: Medicare Other | Attending: Internal Medicine | Admitting: Internal Medicine

## 2016-01-26 DIAGNOSIS — R509 Fever, unspecified: Secondary | ICD-10-CM

## 2016-01-26 DIAGNOSIS — Z66 Do not resuscitate: Secondary | ICD-10-CM

## 2016-01-26 DIAGNOSIS — Z515 Encounter for palliative care: Secondary | ICD-10-CM

## 2016-01-26 DIAGNOSIS — J9621 Acute and chronic respiratory failure with hypoxia: Secondary | ICD-10-CM

## 2016-01-26 DIAGNOSIS — R531 Weakness: Secondary | ICD-10-CM

## 2016-01-26 DIAGNOSIS — R451 Restlessness and agitation: Secondary | ICD-10-CM

## 2016-01-26 LAB — GLUCOSE, CAPILLARY
Glucose-Capillary: 119 mg/dL — ABNORMAL HIGH (ref 65–99)
Glucose-Capillary: 158 mg/dL — ABNORMAL HIGH (ref 65–99)

## 2016-01-26 LAB — URINE CULTURE

## 2016-01-26 LAB — LACTIC ACID, PLASMA: Lactic Acid, Venous: 1.9 mmol/L (ref 0.5–1.9)

## 2016-01-26 LAB — BASIC METABOLIC PANEL
ANION GAP: 7 (ref 5–15)
BUN: 29 mg/dL — ABNORMAL HIGH (ref 6–20)
CHLORIDE: 115 mmol/L — AB (ref 101–111)
CO2: 21 mmol/L — AB (ref 22–32)
CREATININE: 1.52 mg/dL — AB (ref 0.61–1.24)
Calcium: 9.7 mg/dL (ref 8.9–10.3)
GFR calc non Af Amer: 38 mL/min — ABNORMAL LOW (ref >60)
GFR, EST AFRICAN AMERICAN: 44 mL/min — AB (ref >60)
Glucose, Bld: 134 mg/dL — ABNORMAL HIGH (ref 65–99)
POTASSIUM: 3.8 mmol/L (ref 3.5–5.1)
SODIUM: 143 mmol/L (ref 135–145)

## 2016-01-26 LAB — CBC
HEMATOCRIT: 34.2 % — AB (ref 40.0–52.0)
HEMOGLOBIN: 11.8 g/dL — AB (ref 13.0–18.0)
MCH: 30.2 pg (ref 26.0–34.0)
MCHC: 34.5 g/dL (ref 32.0–36.0)
MCV: 87.6 fL (ref 80.0–100.0)
PLATELETS: 81 10*3/uL — AB (ref 150–440)
RBC: 3.9 MIL/uL — AB (ref 4.40–5.90)
RDW: 13.3 % (ref 11.5–14.5)
WBC: 9.6 10*3/uL (ref 3.8–10.6)

## 2016-01-26 LAB — ECHOCARDIOGRAM COMPLETE
HEIGHTINCHES: 67 in
Weight: 2444.46 oz

## 2016-01-26 LAB — PROCALCITONIN: Procalcitonin: <0.1 ng/mL

## 2016-01-26 LAB — HEMOGLOBIN A1C: Hgb A1c MFr Bld: 6 % (ref 4.0–6.0)

## 2016-01-26 LAB — TROPONIN I
TROPONIN I: 0.08 ng/mL — AB (ref <0.03)
Troponin I: 0.05 ng/mL (ref <0.03)
Troponin I: 0.05 ng/mL (ref <0.03)

## 2016-01-26 LAB — BRAIN NATRIURETIC PEPTIDE: B NATRIURETIC PEPTIDE 5: 696 pg/mL — AB (ref 0.0–100.0)

## 2016-01-26 MED ORDER — DEXTROSE 5 % IV SOLN
1.0000 g | INTRAVENOUS | Status: DC
  Filled 2016-01-26: qty 10

## 2016-01-26 MED ORDER — LORAZEPAM 2 MG/ML IJ SOLN
1.0000 mg | INTRAMUSCULAR | Status: DC | PRN
Start: 2016-01-26 — End: 2016-01-26

## 2016-01-26 MED ORDER — MORPHINE BOLUS VIA INFUSION
5.0000 mg | INTRAVENOUS | Status: DC | PRN
  Filled 2016-01-26: qty 20

## 2016-01-26 MED ORDER — SODIUM BICARBONATE 8.4 % IV SOLN: INTRAVENOUS | Status: DC

## 2016-01-26 MED ORDER — CALCIUM CARBONATE ANTACID 500 MG PO CHEW
1.0000 | CHEWABLE_TABLET | ORAL | Status: DC | PRN
  Administered 2016-01-26 (×2): 200 mg via ORAL
  Filled 2016-01-26 (×2): qty 1

## 2016-01-26 MED ORDER — LEVOFLOXACIN IN D5W 750 MG/150ML IV SOLN: 750.0000 mg | INTRAVENOUS | Status: DC

## 2016-01-26 MED ORDER — LORAZEPAM 2 MG/ML IJ SOLN
2.0000 mg | INTRAMUSCULAR | Status: DC | PRN
  Administered 2016-01-26: 2 mg via INTRAVENOUS
  Filled 2016-01-26: qty 1

## 2016-01-26 MED ORDER — MORPHINE SULFATE (PF) 2 MG/ML IV SOLN
1.0000 mg | INTRAVENOUS | Status: DC | PRN
  Administered 2016-01-26: 1 mg via INTRAVENOUS
  Administered 2016-01-26: 2 mg via INTRAVENOUS
  Filled 2016-01-26 (×2): qty 1

## 2016-01-26 MED ORDER — HALOPERIDOL LACTATE 5 MG/ML IJ SOLN
1.0000 mg | Freq: Four times a day (QID) | INTRAMUSCULAR | Status: DC | PRN
Start: 2016-01-26 — End: 2016-01-26

## 2016-01-26 MED ORDER — DEXTROSE 5 % IV SOLN
500.0000 mg | INTRAVENOUS | Status: DC
  Filled 2016-01-26: qty 500

## 2016-01-26 MED ORDER — MORPHINE 100MG IN NS 100ML (1MG/ML) PREMIX INFUSION
10.0000 mg/h | INTRAVENOUS | Status: DC
  Administered 2016-01-26 – 2016-01-27 (×2): 10 mg/h via INTRAVENOUS
  Filled 2016-01-26 (×2): qty 100

## 2016-01-26 MED ORDER — SODIUM ACETATE 2 MEQ/ML IV SOLN
INTRAVENOUS | Status: DC
  Administered 2016-01-26: 11:00:00 via INTRAVENOUS
  Filled 2016-01-26 (×3): qty 1000

## 2016-01-26 NOTE — Progress Notes (Signed)
Pt being moved to room 106. Report called to [employer] Heightsh[facility]ina, rn.  Pt has family at bedside.  They are aware of pt. being moved and will accompany.  Pt remains on morphine drip and will be placed on nasal cannula when moved to 106.

## 2016-01-26 NOTE — Consult Note (Signed)
Patient with extreme agitation, increased WOB and did NOT tolerate trial of BiPAP, extreme hypoxia with confusion and encephalopathy I discussed with family patients poor prognosis, they all have agreed to proceed with comfort care measures I will proceed with comfort care order set and start morphine infusion   Patient/Family are satisfied with Plan of action and management. All questions answered  Lucie LeatherKurian David Reign Bartnick, M.D.  [facility]  Medical Director [facility] Medical Director [facility]

## 2016-01-26 NOTE — Progress Notes (Signed)
eLink Physician-Brief Progress Note Patient Name: Johnny SprinklesMillard D [name] DOB: 1923/05/02 MRN: [id number]   Date of Service  01/26/2016  HPI/Events of Note  Agitated.  eICU Interventions  Ativan and haldol     Intervention Category Major Interventions: Other:  [name] 01/26/2016, 5:19 PM

## 2016-01-26 NOTE — Progress Notes (Addendum)
At [id number] family called staff that something is wrong with pt. Pt was alert but very slow to respond. BP 90/40, HR 109, RR 24, O2 sats 62% on 6L O2 per [place].Dr Sherryll BurgerShah in pt's room. Non -rebreather applied O2 sats up to 73%. Pt denies pain. Rapid response called. Bi-PAP applied by respiratory therapist, pt transferred to ICU.

## 2016-01-26 NOTE — Progress Notes (Signed)
OT Cancellation Note  Patient Details Name: Johnny [name] MRN: [id number] DOB: 1922-11-20   Cancelled Treatment:    Reason Eval/Treat Not Completed: Medical issues which prohibited therapy. Patient transferred to CCU for significant respiratory distress and now on continuous BiPap. New order for OT will be needed due to change in medical status. Please re-consult if and when patient is able to participate in therapy.    Susanne BordersSusan Tamecca Artiga, OTR/L ascom [phone]   01/26/2016, 2:09 PM

## 2016-01-26 NOTE — Progress Notes (Signed)
[employer] - [place] [facility]   PATIENT NAME: Johnny [name]    MR#:  [id number]  DATE OF BIRTH:  1922-11-14  SUBJECTIVE:  CHIEF COMPLAINT:   Chief Complaint  Patient presents with  . Altered Mental Status  Patient became hypoxic with oxygen saturation in the 60s, Very pale/Ash color, lethargic.  Minimally responsive - rapid response called and transferred to CCU. BP 90/40, HR 109, RR 24, O2 sats 62% on 6L O2 Per last nursing report with rapid response REVIEW OF SYSTEMS:  Review of Systems  Unable to perform ROS: critical illness  Neurological: Negative for loss of consciousness.   DRUG ALLERGIES:   Allergies  Allergen Reactions  . Iodine Hives   VITALS:  Blood pressure 130/52, pulse 102, temperature 97.5 F (36.4 C), temperature source Oral, resp. rate 20, height 5\' 7"  (1.702 m), weight 65.772 kg (145 lb), SpO2 92 %. PHYSICAL EXAMINATION:  Physical Exam  Constitutional: He appears lethargic and malnourished. He appears unhealthy. He appears cachectic. He appears toxic. He has a sickly appearance.  HENT:  Head: Normocephalic and atraumatic.  Eyes: Conjunctivae and EOM are normal. Pupils are equal, round, and reactive to light.  Neck: Normal range of motion. Neck supple. No tracheal deviation present. No thyromegaly present.  Cardiovascular: Regular rhythm and normal heart sounds.  Tachycardia present.   Pulmonary/Chest: Accessory muscle usage present. Tachypnea noted. He is in respiratory distress. He has decreased breath sounds. He has no wheezes. He has rhonchi in the right lower field and the left lower field. He exhibits no tenderness.  Abdominal: Soft. Bowel sounds are normal. He exhibits no distension. There is no tenderness.  Musculoskeletal: Normal range of motion.  Neurological: He appears lethargic. He is disoriented. He displays weakness. No cranial nerve deficit.  Minimally responsive  Skin: Skin is warm and dry. No rash noted.    Psychiatric: Mood and affect normal.  Unable to evaluate due to his current mental status   LABORATORY PANEL:   CBC  Recent Labs Lab 01/26/16 0417  WBC 9.6  HGB 11.8*  HCT 34.2*  PLT 81*   ------------------------------------------------------------------------------------------------------------------ Chemistries   Recent Labs Lab 01/29/2016 1113 01/26/16 0417  NA 139 143  K 4.1 3.8  CL 112* 115*  CO2 20* 21*  GLUCOSE 115* 134*  BUN 30* 29*  CREATININE 1.48* 1.52*  CALCIUM 9.4 9.7  AST 28  --   ALT 11*  --   ALKPHOS 67  --   BILITOT 2.2*  --    RADIOLOGY:  Ct Head Wo Contrast  02/08/2016  CLINICAL DATA:  Fall EXAM: CT HEAD WITHOUT CONTRAST TECHNIQUE: Contiguous axial images were obtained from the base of the skull through the vertex without intravenous contrast. COMPARISON:  None. FINDINGS: No mass effect, midline shift, or acute hemorrhage. Mild global atrophy appropriate to age is present. Mild chronic ischemic changes in the periventricular white matter. No skull fracture. Soft tissue injury over the right parietal region is noted. IMPRESSION: No acute intracranial pathology. Electronically Signed   By: Jolaine ClickArthur  Hoss M.D.   On: 01/20/2016 11:55   Ct Chest Wo Contrast  01/24/2016  CLINICAL DATA:  82 year old male with history of chronic respiratory failure and pulmonary fibrosis presenting with recent history of frequent falls and generalized weakness. EXAM: CT CHEST WITHOUT CONTRAST TECHNIQUE: Multidetector CT imaging of the chest was performed following the standard protocol without IV contrast. COMPARISON:  No prior chest CT.  Chest x-ray 02/10/2016. FINDINGS: Mediastinum/Nodes: Heart size is  mildly enlarged. There is no significant pericardial fluid, thickening or pericardial calcification. There is aortic atherosclerosis, as well as atherosclerosis of the great vessels of the mediastinum and the coronary arteries, including calcified atherosclerotic plaque in the left  main, left anterior descending, left circumflex and right coronary arteries. Calcifications of the aortic valve. No pathologically enlarged mediastinal or hilar lymph nodes. Please note that accurate exclusion of hilar adenopathy is limited on noncontrast CT scans. Esophagus is unremarkable in appearance. No axillary lymphadenopathy. Lungs/Pleura: Multifocal patchy areas of ground-glass attenuation and septal thickening scattered throughout the lungs bilaterally (right greater than left). There are also areas with extensive thickening of the peribronchovascular interstitium, with some associated cylindrical and mild varicose bronchiectasis. The areas of greatest involvement in the lungs demonstrate mild honeycombing. No definite craniocaudal gradient identified. Scattered areas of mild air trapping. Scattered thick-walled cystic areas, most evident in the lung bases. No pleural effusions. Upper Abdomen: Status post cholecystectomy.  Aortic atherosclerosis. Musculoskeletal: There are no aggressive appearing lytic or blastic lesions noted in the visualized portions of the skeleton. IMPRESSION: 1. There is a spectrum of findings in the lungs compatible with underlying interstitial lung disease, and given the overall appearance and lack of clear cut craniocaudal gradient, these findings are favored to reflect chronic hypersensitivity pneumonitis. Repeat high-resolution chest CT is suggested in 6-12 months to assess for further temporal changes in the appearance of the lung parenchyma if clinically appropriate. 2. Mild cardiomegaly. 3. There are calcifications of the aortic valve. Echocardiographic correlation for evaluation of potential valvular dysfunction may be warranted if clinically indicated. 4. Aortic atherosclerosis, in addition to left main and 3 vessel coronary artery disease. Electronically Signed   By: Trudie Reed M.D.   On: 01/26/2016 15:08   Dg Chest Port 1 View  02/02/2016  CLINICAL DATA:  Altered  mental status.  Recent head trauma EXAM: PORTABLE CHEST 1 VIEW COMPARISON:  January 24, 2016 and July 18, 2011 FINDINGS: There is extensive interstitial fibrosis throughout the right lung with volume loss. Fibrotic change to a lesser degree is noted on the left. There is no new opacity. There is no frank edema or consolidation. Heart size and pulmonary vascularity are normal. Aorta is tortuous but stable. There is no evident adenopathy. Bones are osteoporotic. IMPRESSION: Stable fibrotic change in the lungs, more severe on the right than on the left. Volume loss noted on the right, stable. No new opacity. Stable cardiac silhouette. Electronically Signed   By: Bretta Bang III M.D.   On: 02/12/2016 11:28   ASSESSMENT AND PLAN:  82 y.o. male with a known history of Chronic respiratory failure, pulmonary fibrosis, on 2 L of oxygen through nasal cannula at home, admitted to the hospital for frequent falls, generalized weakness. Apparently patient was in emergency room day before yesterday because of numerous falls, weakness, he had CT scans done, showing no fractures, he was discharged home, however, was not able to walk. He was more confused, had rigors, shakes, was brought back to emergency room yesterday for further evaluation  * Acute on chronic respiratory failure with hypoxia (HCC): - He could have aspirated or choked on his thick mucus - Was hypoxic in 60s, requiring Ventimask - get pulmonary involved for further recommendations - discussed with Dr. Belia Heman  * Sepsis:  - Present on admission, lactic acid of 1.0 - family reported thick cream-colored phlegm, had fever on admission. BP 90/40, HR 109, RR 24, O2 sats 62% on 6L O2  - Likely source lung -  Pulmonary consult placed.  Discussed with Dr. Belia Heman,  - CT chest performed on ninth of July 2017 showed interstitial lung disease with findings of chronic hypersensitivity pneumonitis - On empiric Levaquin at this time   * COPD exacerbation - On IV  Solu-Medrol -Empiric  antibiotics,  await sputum cultures, adjust antibiotics depending on culture results  * Borderline elevated troponin - Likely due to supply demand ischemia - Cannot rule out MI at this time - We will order echocardiogram and 2 more sets of troponin - Monitor on telemetry - Continue aspirin  * Generalized weakness - Multifactorial - get physical therapist, occupational therapist evaluation, possible rehabilitation placement if patient is able to follow commands  * Thrombocytopenia, chronic - Monitor   * chronic kidney disease, stage III - Close to baseline - Monitor     All the records are reviewed and case discussed with Care Management/Social Worker. Management plans discussed with the patient, family (wife and daughter at bedside) and they are in agreement to change CODE STATUS from full code to DO NOT RESUSCITATE based on patient wishes   CODE STATUS: DO NOT RESUSCITATE, palliative care consultation  TOTAL TIME (Critical care) TAKING CARE OF THIS PATIENT: 35 minutes.   More than 50% of the time was spent in counseling/coordination of care: YES  POSSIBLE D/C IN 2-3 DAYS, DEPENDING ON CLINICAL CONDITION.    [facility], Maude Gloor M.D on 01/26/2016 at 8:15 AM  Between 7am to 6pm - Pager - [phone]  After 6pm go to [email or web address] - Social research officer, government  [employer] [place] [facility]  Office  [phone]  CC: Primary care physician; Danella Penton, MD  Note: This dictation was prepared with Dragon dictation along with smaller phrase technology. Any transcriptional errors that result from this process are unintentional.

## 2016-01-26 NOTE — Consult Note (Signed)
After further discussion with daughters and wife, I have explained patients worsening resp status, patient is struggling to breathe on high flow [place], will need to address going back on biPAP and assess resp status, family requesting dog visitation prior to possible comfort care measures. Will oblige patient and family request.  Family has witnessed progressive decline of patients health and function.  Will try biPAP and assess    Patient/Family are satisfied with Plan of action and management. All questions answered  Lucie LeatherKurian David Lenola Lockner, M.D.  [facility]  Medical Director [facility] Medical Director [facility]

## 2016-01-26 NOTE — Consult Note (Signed)
PULMONARY / CRITICAL CARE MEDICINE   Name: Johnny [name] MRN: [id number] DOB: 09/21/1922    ADMISSION DATE:  01/29/2016 CONSULTATION DATE:  01/26/16  REFERRING MD:  Dr. Sherryll BurgerShah  CHIEF COMPLAINT:  Altered Mental Status  HISTORY OF PRESENT ILLNESS:   This is a 82 y.o. Male with PMH of chronic respiratory failure, pulmonary fibrosis, on chronic 2L O2 via nasal cannula at home.  He presented to [facility] on 7/8 with c/o post fall in the bathroom with a head injury and bleeding a CT scan was performed showing no fractures and he was discharged home, however he was not able to walk.  On 7/9 he became more confused, had rigors and shakes was brought back to the ER for further evaluation.  In the ER pts O2 sats were in the high 70's to low 80's on 2L via nasal canula .  Chest xray showed pulmonary fibrosis.  He had a productive cough with cream colored thick mucous, however pt has been choking on phlegm.  It was also noted pt had a fever on 7/9 of 101.2.  On 7/10 PCCM consulted due to acute on chronic hypoxic respiratory failure secondary to pulmonary fibrosis and ?CAP he was initially placed on non rebreather, however he further decompensated requiring bipap and transferred to ICU.   PAST MEDICAL HISTORY :  He  has a past medical history of Pulmonary fibrosis (HCC).  PAST SURGICAL HISTORY: He  has past surgical history that includes Cholecystectomy.  Allergies  Allergen Reactions  . Iodine Hives    No current facility-administered medications on file prior to encounter.   Current Outpatient Prescriptions on File Prior to Encounter  Medication Sig  . azithromycin (ZITHROMAX Z-PAK) 250 MG tablet Take 2 tablets (500 mg) on  Day 1,  followed by 1 tablet (250 mg) once daily on Days 2 through 5.  . predniSONE (DELTASONE) 20 MG tablet Take 1 tablet (20 mg total) by mouth 2 (two) times daily with a meal.    FAMILY HISTORY:  His has no family status information on file.   SOCIAL HISTORY: He  reports that  he has quit smoking. His smoking use included Cigarettes. He has never used smokeless tobacco. He reports that he does not drink alcohol or use illicit drugs.  REVIEW OF SYSTEMS:  Positives in BOLD Gen: Denies fever, chills, weight change, fatigue, night sweats HEENT: Denies blurred vision, double vision, hearing loss, tinnitus, sinus congestion, rhinorrhea, sore throat, neck stiffness, dysphagia PULM: shortness of breath, cough, sputum production, hemoptysis, wheezing CV: Denies chest pain, edema, orthopnea, paroxysmal nocturnal dyspnea, palpitations GI: Denies abdominal pain, nausea, vomiting, diarrhea, hematochezia, melena, constipation, change in bowel habits GU: Denies dysuria, hematuria, polyuria, oliguria, urethral discharge Endocrine: Denies hot or cold intolerance, polyuria, polyphagia or appetite change Derm: Denies rash, dry skin, scaling or peeling skin change Heme: Denies easy bruising, bleeding, bleeding gums Neuro: Denies headache, numbness, weakness, slurred speech, loss of memory or consciousness   SUBJECTIVE:  Pt is currently on 100% bipap he says his breathing has improved.   VITAL SIGNS: BP 130/52 mmHg  Pulse 102  Temp(Src) 97.5 F (36.4 C) (Oral)  Resp 20  Ht 5\' 7"  (1.702 m)  Wt 145 lb (65.772 kg)  BMI 22.71 kg/m2  SpO2 92%     INTAKE / OUTPUT: I/O last 3 completed shifts: In: 530.7 [I.V.:530.7] Out: 650 [Urine:650]  PHYSICAL EXAMINATION: General:  Chronically ill appearing male Neuro:  Alert and oriented, following commands HEENT:  Supple, no JVD  Cardiovascular:  Sinus tach, s1s2, no M/R/G Lungs:  Diminished throughout, even, non labored requiring bipap Abdomen:  Hypoactive BSx4, soft, non tender, non distended Musculoskeletal:  Normal bulk and tone Skin:  5 cm linear laceration on the right posterior parietal scalp area with staples in place well approximated    Recent Labs Lab 01/24/16 1140 02/13/2016 1113 01/26/16 0417  NA 139 139 143  K 3.7  4.1 3.8  CL 110 112* 115*  CO2 19* 20* 21*  BUN 30* 30* 29*  CREATININE 1.56* 1.48* 1.52*  GLUCOSE 127* 115* 134*    Electrolytes  Recent Labs Lab 01/24/16 1140 01/18/2016 1113 01/26/16 0417  CALCIUM 9.6 9.4 9.7    CBC  Recent Labs Lab 01/24/16 1140 01/30/2016 1113 01/26/16 0417  WBC 7.3 9.7 9.6  HGB 13.6 12.5* 11.8*  HCT 39.9* 36.2* 34.2*  PLT 93* 82* 81*    Coag's No results for input(s): APTT, INR in the last 168 hours.  Sepsis Markers  Recent Labs Lab 02/10/2016 1113 01/30/2016 1414  LATICACIDVEN 1.3 1.0    ABG No results for input(s): PHART, PCO2ART, PO2ART in the last 168 hours.  Liver Enzymes  Recent Labs Lab 01/29/2016 1113  AST 28  ALT 11*  ALKPHOS 67  BILITOT 2.2*  ALBUMIN 3.3*    Cardiac Enzymes  Recent Labs Lab 02/05/2016 1546 01/18/2016 2218 01/26/16 0417  TROPONINI 0.03* 0.04* 0.05*    Glucose  Recent Labs Lab 01/26/16 0758  GLUCAP 119*    Imaging Ct Head Wo Contrast  01/20/2016  CLINICAL DATA:  Fall EXAM: CT HEAD WITHOUT CONTRAST TECHNIQUE: Contiguous axial images were obtained from the base of the skull through the vertex without intravenous contrast. COMPARISON:  None. FINDINGS: No mass effect, midline shift, or acute hemorrhage. Mild global atrophy appropriate to age is present. Mild chronic ischemic changes in the periventricular white matter. No skull fracture. Soft tissue injury over the right parietal region is noted. IMPRESSION: No acute intracranial pathology. Electronically Signed   By: Jolaine Click M.D.   On: 01/18/2016 11:55   Ct Chest Wo Contrast  01/26/2016  CLINICAL DATA:  82 year old male with history of chronic respiratory failure and pulmonary fibrosis presenting with recent history of frequent falls and generalized weakness. EXAM: CT CHEST WITHOUT CONTRAST TECHNIQUE: Multidetector CT imaging of the chest was performed following the standard protocol without IV contrast. COMPARISON:  No prior chest CT.  Chest x-ray  01/24/2016. FINDINGS: Mediastinum/Nodes: Heart size is mildly enlarged. There is no significant pericardial fluid, thickening or pericardial calcification. There is aortic atherosclerosis, as well as atherosclerosis of the great vessels of the mediastinum and the coronary arteries, including calcified atherosclerotic plaque in the left main, left anterior descending, left circumflex and right coronary arteries. Calcifications of the aortic valve. No pathologically enlarged mediastinal or hilar lymph nodes. Please note that accurate exclusion of hilar adenopathy is limited on noncontrast CT scans. Esophagus is unremarkable in appearance. No axillary lymphadenopathy. Lungs/Pleura: Multifocal patchy areas of ground-glass attenuation and septal thickening scattered throughout the lungs bilaterally (right greater than left). There are also areas with extensive thickening of the peribronchovascular interstitium, with some associated cylindrical and mild varicose bronchiectasis. The areas of greatest involvement in the lungs demonstrate mild honeycombing. No definite craniocaudal gradient identified. Scattered areas of mild air trapping. Scattered thick-walled cystic areas, most evident in the lung bases. No pleural effusions. Upper Abdomen: Status post cholecystectomy.  Aortic atherosclerosis. Musculoskeletal: There are no aggressive appearing lytic or blastic lesions noted in the  visualized portions of the skeleton. IMPRESSION: 1. There is a spectrum of findings in the lungs compatible with underlying interstitial lung disease, and given the overall appearance and lack of clear cut craniocaudal gradient, these findings are favored to reflect chronic hypersensitivity pneumonitis. Repeat high-resolution chest CT is suggested in 6-12 months to assess for further temporal changes in the appearance of the lung parenchyma if clinically appropriate. 2. Mild cardiomegaly. 3. There are calcifications of the aortic valve.  Echocardiographic correlation for evaluation of potential valvular dysfunction may be warranted if clinically indicated. 4. Aortic atherosclerosis, in addition to left main and 3 vessel coronary artery disease. Electronically Signed   By: Trudie Reed M.D.   On: 2016-02-07 15:08   Dg Chest Port 1 View  02/07/2016  CLINICAL DATA:  Altered mental status.  Recent head trauma EXAM: PORTABLE CHEST 1 VIEW COMPARISON:  January 24, 2016 and July 18, 2011 FINDINGS: There is extensive interstitial fibrosis throughout the right lung with volume loss. Fibrotic change to a lesser degree is noted on the left. There is no new opacity. There is no frank edema or consolidation. Heart size and pulmonary vascularity are normal. Aorta is tortuous but stable. There is no evident adenopathy. Bones are osteoporotic. IMPRESSION: Stable fibrotic change in the lungs, more severe on the right than on the left. Volume loss noted on the right, stable. No new opacity. Stable cardiac silhouette. Electronically Signed   By: Bretta Bang III M.D.   On: 2016-02-07 11:28   7/9 CT chest showed extensive b/l infiltrates with honeycombing c/w IPF,   STUDIES:  CT of Cervical Spine 7/8>>No fracture or spondylolisthesis. Multilevel arthropathy. Carotid artery calcification bilaterally. Apical pulmonary fibrosis CT of Chest 7/9>>There is a spectrum of findings in the lungs compatible with underlying interstitial lung disease, and given the overall appearance and lack of clear cut craniocaudal gradient, these findings are favored to reflect chronic hypersensitivity pneumonitis Mild cardiomegaly CT of Head 7/9>>negative  CULTURES: Blood x2 7/9>>negative>> Urine 7/9>> Sputum>>  ANTIBIOTICS: Levaquin 7/9>> roephin/azithromycin  SIGNIFICANT EVENTS: -7/8 presented to [facility] post fall in the bathroom at home with a head injury and bleeding a CT scan was performed showing no fractures and he was discharged home, however he was not able  to walk. -7/9 he became more confused, had rigors and shakes at home was brought back to the ER for further evaluation.  In the ER pts O2 sats were in the high 70's to low 80's on 2L via nasal canula therefore he was admitted to [facility] -7/10 pt developed acute on chronic hypoxic respiratory failure requiring bipap and transferred to ICU PCCM consulted  LINES/TUBES: PIV  DISCUSSION: 82 y.o. Male On 7/9 he became more confused, had rigors and shakes was brought back to the ER for further evaluation.  In the ER pts O2 sats her in the high 70's to low 80's on 2L via nasal canula .  Chest xray showed pulmonary fibrosis. On 7/10 PCCM consulted due to acute on chronic hypoxic respiratory failure he was initially placed on non rebreather, however he further decompensated requiring bipap and transferred to ICU.  With acute pneumonia  ASSESSMENT / PLAN:  PULMONARY A: Acute on chronic hypoxic respiratory failure secondary to pulmonary fibrosis and ?CAP Metabolic Acidosis P:   Will transition to HFNC to maintain O2 sats 88% or above Continue albuterol, pulmicort, solumedrol, spiriva, and mucinex Repeat sputum culture 7/10 Continuous pulse oximetry for now Intermittent CXR and ABG's Obtain BNP today 7/10  CARDIOVASCULAR  A:  No acute issues P:  Telemetry monitoring Continue aspirin Echo Pending Trend troponins  RENAL A:   Acute renal failure (7/10-creatinine 1.52) P:   Trend BMP's Monitor UOP Replace electrolytes as indicated Start sodium bicarb drip with 3 amps at 50 ml/hr 7/10  GASTROINTESTINAL A:  No acute issues   P:   Protonix for PUD Continue colace and prn Miralax   Continue DYS 1 diet as tolerated   HEMATOLOGIC A:   Anemia P:  Lovenox for VTE prophylaxis Monitor for s/sx of bleeding Transfuse for HgB <7  INFECTIOUS A:   ?CAP-infiltrates on CXR P:   Continue abx as listed above Trend WBC and monitor fever curve Trend PCT's and lactic acid   ENDOCRINE A:    No acute issues P:   Monitor serum glucose  NEUROLOGIC A:   No acute issues P:   RASS goal: N/A Avoid sedating medications Continue prozac    FAMILY  - Updates: Pt and pts wife updated about plan of care and questions answered  - Inter-disciplinary family meet or Palliative Care meeting due by:  02/02/16   Sonda Rumble, AGNP  Pulmonary/Critical Care Pager [phone] (please enter 7 digits) PCCM Consult Pager [phone] (please enter 7 digits)  STAFF NOTE: I, Dr. Lucie Leather,  have personally reviewed patient's available data, including medical history, events of note, physical examination and test results as part of my evaluation. I have discussed with NP and other care providers such as pharmacist, RN and RRT.  In addition,  I personally evaluated patient and elicited key findings  +crackels on exam   A:acute resp failure from pneumonia in setting of IPF  P: continue abx, steroids, BD therapy, update family when available     The Rest per NP whose note is outlined above and that I agree with  I have personally reviewed/obtained a history, examined the patient, evaluated Pertinent laboratory and RadioGraphic/imaging results, and  formulated the assessment and plan   The Patient requires high complexity decision making for assessment and support, frequent evaluation and titration of therapies, application of advanced monitoring technologies and extensive interpretation of multiple databases. This Critical care time does not reflrect procedure time or supervisory time of NP but could involve care discussion time  Overall, patient is critically ill, prognosis is guarded.    Patient is DNR/DNI  Lucie Leather, M.D.  [facility]  Medical Director [facility] Medical Director [facility] Cardio-Pulmonary Department

## 2016-01-26 NOTE — Progress Notes (Signed)
   01/26/16 0800  Clinical Encounter Type  Visited With Patient and family together  Visit Type Initial;Critical Care;Patient actively dying  Referral From Nurse  Consult/Referral To Chaplain  Spiritual Encounters  Spiritual Needs Prayer  Stress Factors  Patient Stress Factors Exhausted;Health changes  Family Stress Factors Major life changes  Patient moved from 1C to ICU

## 2016-01-26 NOTE — Progress Notes (Signed)
PT Cancellation Note  Patient Details Name: Johnny [name] MRN: [id number] DOB: 07/04/1923   Cancelled Treatment:    Reason Eval/Treat Not Completed: Medical issues which prohibited therapy. Patient noted to have been transferred to CCU since the time of PT evaluation for significant respiratory distress. It appears he is now on continuous BiPap. PT will go ahead and cancel this order given the change in status since the time of the order. Please re-consult PT as patient becomes more stable for OOB mobility assessment.   Kerin RansomPatrick A Derrion Tritz, PT, DPT    01/26/2016, 12:50 PM

## 2016-01-26 NOTE — Care Management (Signed)
Transferred to ICU following respiratory distress and rapid response. Admitted with hx of chronic respiratory failure, pulmonary fibrosis on chronic 2L. CSW following for possible placement.

## 2016-01-26 NOTE — Clinical Social Work Note (Signed)
CSW consulted for possible placement. Patient is currently on continuous bipap and is unable to participate in therapy at this time. CSW will follow in the event patient is able to come off bipap and participate in physical therapy. York SpanielMonica Nixie Laube MSW,LCSW [phone]

## 2016-01-26 NOTE — Evaluation (Signed)
Clinical/Bedside Swallow Evaluation Patient Details  Name: Johnny [name] MRN: [id number] Date of Birth: 05/05/1923  Today's Date: 01/26/2016 Time: SLP Start Time (ACUTE ONLY): 1300 SLP Stop Time (ACUTE ONLY): 1350 SLP Time Calculation (min) (ACUTE ONLY): 50 min  Past Medical History:  Past Medical History  Diagnosis Date  . Pulmonary fibrosis [facility])    Past Surgical History:  Past Surgical History  Procedure Laterality Date  . Cholecystectomy     HPI:  Pt is a 82 y.o. male with a known history ofReflux, Chronic respiratory failure, pulmonary fibrosis, on 2 L of oxygen through nasal cannula at home, who presents to the hospital with complaints of frequent falls, generalized weakness. Apparently patient was admitted to emergency room yesterday because of numerous falls, weakness, he had CT scans done, showing no fractures, he was discharged home, however, was not able to walk. He was more confused, had rigors, shakes, was brought back to emergency room for further evaluation. Apparently, patient's oxygen level has been running low as well, in the high 70s, low 80s on his usual oxygen doses. Chest x-ray the emergency room was unremarkable. Pulmonary fibrosis. Patient's 5, however, is telling me that patient is producing cream-colored thick mucous, difficult to lift it up, patient has been choking on phlegm. Pt has a Raymond-standing h/o Reflux per wife w/ s/s of phlegm during the night and day. Also h/o Presbyesophagus was noted by MD during MBSS last year which can impact overall intake and increase risk for regurgitation of food/liquid material.    Assessment / Plan / Recommendation Clinical Impression  Pt appeared to adequately tolerate trials of Dysphagia level 2 w/ thin liquids diet w/ no immediate, overt s/s of aspiration noted. Pt required rest breaks as increased respiratory effort was noted w/ any exertion; pt is currently on increased O2 support via HFNC. Pt appeared to adequately  masticate and orally clear the consistencies of foods and liquids. A-P transfer and swallowing appeared wfl and no overt coughing, throat clearing noted taking single bites/sips slowly following general aspiration precautions and NOT talking during eating/drinking. Pt appeared easily distracted and min. confused requiring support during feeidng tasks. SLP discussed the need for aspiration precautions w/ wife and pt and encouraged her to continue giving him bites/sips as he desired but to give frequent rest breaks to allow for his breathing to calm as this could increase his risk for aspiration; also recommended reducing distractions during meals to allow him to focus on the task. As pt appears at min. increased risk for aspiration sec. to pulmonary status currently, and d/t his baseline GERD/reflux w/ phlegm, recommend a Dysphagia level 2 w/ thin liquids; aspiration precautions; medications in Puree (Whole as able) for easier, safer swallowing.     Aspiration Risk  Mild aspiration risk (sec. to decline pulmonary status)    Diet Recommendation  Dysphagia level 2 w/ thin liquids; aspiration precautions; strict REFLUX precautions. Feeding assistance at meals.   Medication Administration: Whole meds with puree for easier, safer swallowing d/t pulmonary status   Other  Recommendations Recommended Consults:  (Dietician) Oral Care Recommendations: Oral care BID;Staff/trained caregiver to provide oral care   Follow up Recommendations   (TBD)    Frequency and Duration min 3x week  2 weeks       Prognosis Prognosis for Safe Diet Advancement: Fair Barriers to Reach Goals: Cognitive deficits;Severity of deficits      Swallow Study   General Date of Onset: 02-02-2016 HPI: Pt is a 82 y.o. male with  a known history ofReflux, Chronic respiratory failure, pulmonary fibrosis, on 2 L of oxygen through nasal cannula at home, who presents to the hospital with complaints of frequent falls, generalized weakness.  Apparently patient was admitted to emergency room yesterday because of numerous falls, weakness, he had CT scans done, showing no fractures, he was discharged home, however, was not able to walk. He was more confused, had rigors, shakes, was brought back to emergency room for further evaluation. Apparently, patient's oxygen level has been running low as well, in the high 70s, low 80s on his usual oxygen doses. Chest x-ray the emergency room was unremarkable. Pulmonary fibrosis. Patient's 5, however, is telling me that patient is producing cream-colored thick mucous, difficult to lift it up, patient has been choking on phlegm. Pt has a Spragg-standing h/o Reflux per wife w/ s/s of phlegm during the night and day. Also h/o Presbyesophagus was noted by MD during MBSS last year which can impact overall intake and increase risk for regurgitation of food/liquid material.  Type of Study: Bedside Swallow Evaluation Previous Swallow Assessment: MBSS in 01/2015 - no oropharyngeal phase dysphagia but Esophageal phase dysmotility was noted Diet Prior to this Study: Dysphagia 3 (soft);Thin liquids Temperature Spikes Noted: Yes Respiratory Status:  (HFNC w/ O2 support) History of Recent Intubation: No Behavior/Cognition: Cooperative;Pleasant mood;Confused;Distractible;Requires cueing (awake; min. increased respiratory effort) Oral Cavity Assessment: Dry Oral Care Completed by SLP: Yes Oral Cavity - Dentition: Adequate natural dentition (missing few) Vision: Functional for self-feeding Self-Feeding Abilities: Able to feed self;Needs assist;Needs set up;Total assist (weak, shaky overall) Patient Positioning: Upright in bed Baseline Vocal Quality: Normal Carmon Sails(quivery) Volitional Cough: Strong Volitional Swallow: Able to elicit    Oral/Motor/Sensory Function Overall Oral Motor/Sensory Function: Within functional limits   Ice Chips Ice chips: Within functional limits Presentation: Spoon (fed; x2 trials)   Thin Liquid  Thin Liquid: Within functional limits Presentation: Self Fed;Cup;Straw (assisted; ~4 ozs total) Other Comments: pt required assistance w/ task; min. increased respiratory effort    Nectar Thick Nectar Thick Liquid: Not tested   Honey Thick Honey Thick Liquid: Not tested   Puree Puree: Within functional limits Presentation: Spoon (fed; 4 trials) Other Comments: min. increased respiratory effort   Solid   GO   Solid: Within functional limits (Level 2 consistency; 4 trials) Presentation: Spoon (fed) Other Comments: min. increased respiratory effort       Jerilynn SomKatherine Watson, MS, CCC-SLP  Watson,Katherine 01/26/2016,3:34 PM

## 2016-01-26 NOTE — Consult Note (Signed)
Consultation Note Date: 01/26/2016   Patient Name: Johnny [name]  DOB: Feb 16, 1923  MRN: [id number]  Age / Sex: 82 y.o., male  PCP: Johnny Penton, MD Referring Physician: Delfino Lovett, MD  Reason for Consultation: Establishing goals of care, Non pain symptom management, Pain control and Psychosocial/spiritual support  HPI/Patient Profile:   82 y.o. male   admitted on January 30, 2016 with  a known history of chronic respiratory failure, pulmonary fibrosis, on 2 L of oxygen through nasal cannula at home, who presents to the hospital with complaints of frequent falls, generalized weakness.  Patient was admitted to emergency room yesterday because of numerous falls, weakness, he had CT scans done, showing no fractures, he was discharged home, however, was not able to walk.   He was more confused, and was brought back to emergency room for further evaluation. He had fever. for admission. Currently on high flow O2, is confused and appears generally uncomfortable.  Family is faced with continued treatment options, advanced directive decisions and anticipatory care needs.   Clinical Assessment and Goals of Care:  This NP Johnny [name] reviewed medical records, received report from team, assessed the patient and then meet at the patient's bedside along with his wife, two daughters  to discuss diagnosis prognosis, GOC, EOL wishes disposition and options.   A detailed discussion was had today regarding advanced directives.  Concepts specific to code status, artifical feeding and hydration, continued IV antibiotics and rehospitalization was had.  The difference between a aggressive medical intervention path  and a palliative comfort care path for this patient at this time was had.  Values and goals of care important to patient and family were attempted to be elicited.  Concept of Hospice and Palliative Care were discussed   Questions and concerns addressed.  MOST form introduced  Family encouraged to call with questions or concerns.  PMT will continue to support holistically.     SUMMARY OF RECOMMENDATIONS    Code Status/Advance Care Planning:  DNR    Symptom Management:    Recommendations   Dyspnea/Pain: Initiate Roxanol 5 mg po/sl every 3 hrs prn   Agitation: Ativan 1 mg po/sl every 6 hrs prn  Symptom management approach impacted by decisions regarding plan and goals of care  Palliative Prophylaxis:    Aspiration, Bowel Regimen, Delirium Protocol, Frequent Pain Assessment and Oral Care  Additional Recommendations (Limitations, Scope, Preferences):  At this time family is hopeful for improvement, treat the treatable.  They wish to discuss current medical situation with attending ( discussed with Johnny [name], he plans to meet with family shortly, currently ECHO in progress)  This NP will continue to support family holistically. Will f/u in the morning for continued discussion regarding GOC.        Psycho-social/Spiritual:    Emotional support offered to family this is very difficult for them seeing Johnny [name] ill and vulnerable.  They view him as an independent man. ( they tell me he was fully independent one month ago)  Prognosis:  Dependant on desire for life prolonging measures.  Discharge Planning: To Be Determined      Primary Diagnoses: Present on Admission:  **None**  I have reviewed the medical record, interviewed the patient and family, and examined the patient. The following aspects are pertinent.  Past Medical History  Diagnosis Date  . Pulmonary fibrosis [facility](HCC)    Social History   Social History  . Marital Status: Married    Spouse Name: N/A  . Number of Children: N/A  . Years of Education: N/A   Social History Main Topics  . Smoking status: Former Smoker    Types: Cigarettes  . Smokeless tobacco: Never Used  . Alcohol Use: No  . Drug Use: No  . Sexual  Activity: Not Asked   Other Topics Concern  . None   Social History Narrative   History reviewed. No pertinent family history. Scheduled Meds: . albuterol  2.5 mg Nebulization Q4H  . antiseptic oral rinse  7 mL Mouth Rinse BID  . aspirin  81 mg Oral Daily  . [START ON 2016-06-19] azithromycin  500 mg Intravenous Q24H  . budesonide (PULMICORT) nebulizer solution  0.25 mg Nebulization BID  . cefTRIAXone (ROCEPHIN)  IV  1 g Intravenous Q24H  . docusate sodium  100 mg Oral BID  . enoxaparin (LOVENOX) injection  30 mg Subcutaneous Q24H  . FLUoxetine  20 mg Oral Daily  . guaiFENesin  600 mg Oral BID  . methylPREDNISolone (SOLU-MEDROL) injection  60 mg Intravenous Q24H  . nitroGLYCERIN  1 inch Topical Q6H  . pantoprazole  40 mg Oral Daily  . sodium chloride flush  3 mL Intravenous Q12H  . sodium chloride HYPERTONIC  4 mL Nebulization Daily  . tiotropium  18 mcg Inhalation Daily   Continuous Infusions: . dextrose 5 % 1,000 mL with sodium acetate 150 mEq infusion 50 mL/hr at 01/26/16 1103   PRN Meds:.sodium chloride, acetaminophen **OR** acetaminophen, calcium carbonate, ondansetron **OR** ondansetron (ZOFRAN) IV, polyethylene glycol, sodium chloride flush Medications Prior to Admission:  Prior to Admission medications   Medication Sig Start Date End Date Taking? Authorizing Provider  azithromycin (ZITHROMAX Z-PAK) 250 MG tablet Take 2 tablets (500 mg) on  Day 1,  followed by 1 tablet (250 mg) once daily on Days 2 through 5. 01/24/16  Yes Sharman CheekPhillip Stafford, MD  FLUoxetine (PROZAC) 20 MG capsule Take 20 mg by mouth daily. 11/20/15  Yes Historical Provider, MD  fluticasone (FLONASE) 50 MCG/ACT nasal spray Place 1 spray into the nose daily. 06/26/15  Yes Historical Provider, MD  loratadine (CLARITIN) 10 MG tablet Take 10 mg by mouth daily.   Yes Historical Provider, MD  omeprazole (PRILOSEC) 40 MG capsule Take 40 mg by mouth every morning. 01/11/16  Yes Historical Provider, MD  polyethylene glycol  (MIRALAX / GLYCOLAX) packet Take 8.5 g by mouth daily. For constipation.   Yes Historical Provider, MD  predniSONE (DELTASONE) 20 MG tablet Take 1 tablet (20 mg total) by mouth 2 (two) times daily with a meal. 01/24/16  Yes Sharman CheekPhillip Stafford, MD  QUEtiapine (SEROQUEL) 50 MG tablet Take 25 mg by mouth at bedtime as needed.   Yes Historical Provider, MD  spironolactone (ALDACTONE) 25 MG tablet Take 12.5 mg by mouth daily. 12/18/15  Yes Historical Provider, MD   Allergies  Allergen Reactions  . Iodine Hives   Review of Systems  Unable to perform ROS: Mental status change    Physical Exam  Constitutional: He appears ill.  - restless  HENT:  Mouth/Throat: Oropharynx is clear and moist.  Cardiovascular: Tachycardia present.   Pulmonary/Chest: He has decreased breath sounds in the right lower field and the left lower field.  Skin: Skin is warm and dry.    Vital Signs: BP 121/53 mmHg  Pulse 96  Temp(Src) 98.5 F (36.9 C) (Oral)  Resp 23  Ht  (1.702 m)  Wt 69.3 kg (152 lb 12.5 oz)  BMI 23.92 kg/m2  SpO2 94% Pain Assessment: No/denies pain       SpO2: SpO2: 94 % O2 Device:SpO2: 94 % O2 Flow Rate: .O2 Flow Rate (L/min): 50 L/min  IO: Intake/output summary:  Intake/Output Summary (Last 24 hours) at 01/26/16 1520 Last data filed at 01/26/16 1500  Gross per 24 hour  Intake 530.67 ml  Output    900 ml  Net -369.33 ml    LBM: Last BM Date: Jan 31, 2016 Baseline Weight: Weight: 68.7 kg (151 lb 7.3 oz) Most recent weight: Weight: 69.3 kg (152 lb 12.5 oz)     Palliative Assessment/Data: 20%   Discussed with Johnny Belia [name]  Time In: 1400 Time Out: 1515 Time Total: 75 min Greater than 50%  of this time was spent counseling and coordinating care related to the above assessment and plan.  Signed by: Johnny Creed, NP   Please contact Palliative Medicine Team phone at [phone] for questions and concerns.  For individual provider: See Loretha Stapler

## 2016-01-26 NOTE — Progress Notes (Signed)
*  PRELIMINARY RESULTS* Echocardiogram 2D Echocardiogram has been performed.  Johnny [name] 01/26/2016, 3:03 PM 

## 2016-01-26 NOTE — Significant Event (Signed)
RRT paged at [phone] for this patient who was hypoxic in respiratory distress on 100% NRB.  Patient is DNR status with hx of pulmonary fibrosis.  Patient's respiratory distress not imporved on NRB so patient transferred to ICU 19 to be placed on Bipap.  I informed Sonda Rumbleana Blakeney, NP as well as Dr Belia HemanKasa of patient's status and arrival to ICU.  Patient's wife at the bedside.

## 2016-01-30 LAB — CULTURE, BLOOD (ROUTINE X 2)
Culture: NO GROWTH
Culture: NO GROWTH

## 2016-01-30 LAB — BLOOD GAS, ARTERIAL
ACID-BASE DEFICIT: 9.5 mmol/L — AB (ref 0.0–2.0)
Allens test (pass/fail): POSITIVE — AB
Bicarbonate: 17.6 mEq/L — ABNORMAL LOW (ref 21.0–28.0)
Delivery systems: POSITIVE
EXPIRATORY PAP: 6
FIO2: 1
INSPIRATORY PAP: 12
O2 Saturation: 99.4 %
PATIENT TEMPERATURE: 37
PH ART: 7.23 — AB (ref 7.350–7.450)
PO2 ART: 176 mmHg — AB (ref 83.0–108.0)
RATE: 12 resp/min
pCO2 arterial: 42 mmHg (ref 32.0–48.0)

## 2016-02-17 NOTE — Discharge Summary (Signed)
[facility] [place] [facility]    Death Note   In breif - 82 y.o. male with a known history of Chronic respiratory failure, pulmonary fibrosis, on 2 L of oxygen through nasal cannula at home, admitted to the hospital for frequent falls, generalized weakness.  Had episode of worsening hypoxia requiring transfer to intensive care and family discussion was started for comfort care considering overall poor clinical condition.  Family agreed for comfort care and was placed on morphine drip.   Whitman HeroMillard Garcialopez CSN:[id number],MRN:[id number] is a 82 y.o. male, Outpatient Primary MD for the patient is Danella PentonMark F Miller, MD  Pronounced dead by nurse on April 16, 2016 @ 7: 40 am                  Cause of death:  1. Sepsis 2. Interstitial lung Disease 3. Chronic Kidney Disease   Delfino LovettSHAH, Tama Grosz M.D on 01/28/2016 at 7:22 AM  [facility]    OFFICE [phone]  Total clinical and documentation time for today Under 30 minutes   Last Note  82 y.o. male with a known history of Chronic respiratory failure, pulmonary fibrosis, on 2 L of oxygen through nasal cannula at home, admitted to the hospital for frequent falls, generalized weakness. Apparently patient was in emergency room day before yesterday because of numerous falls, weakness, he had CT scans done, showing no fractures, he was discharged home, however, was not able to walk. He was more confused, had rigors, shakes, was brought back to emergency room for further evaluation.  * Acute on chronic respiratory failure with hypoxia: with COPD exacerbation - He could have aspirated or choked on his thick mucus - Was hypoxic in 60s  * Sepsis:  - Present on admission, lactic acid of 1.0 - family reported thick cream-colored phlegm, had fever on admission. BP 90/40, HR 109, RR 24, O2 sats 62% on 6L O2  - Likely source lung - CT chest performed on ninth of July 2017 showed interstitial  lung disease with findings of chronic hypersensitivity pneumonitis - On empiric Levaquin at this time   * COPD exacerbation - On IV Solu-Medrol -Empiricantibiotics  * Borderline elevated troponin - Likely due to supply demand ischemia  * Generalized weakness - Multifactorial  * Thrombocytopenia, chronic - Monitor   * chronic kidney disease, stage III - Close to baseline - Monitor

## 2016-02-17 NOTE — Clinical Social Work Note (Signed)
CSW consulted for possible placement. Pt was made comfort care and expired this morning. CSW is signing off as no further needs identified.   Johnny QuerySarah Alesana [name], MSW, LCSW  Clinical Social Worker [phone]

## 2016-02-17 DEATH — deceased

## 2017-06-13 IMAGING — CT CT CHEST W/O CM
2 of 3 series · 16 of 46 positions shown, 18 images · non-contrast
Comparison: No prior chest CT.  Chest x-ray 01/25/2016.

CLINICAL DATA: [AGE] male with history of chronic respiratory
failure and pulmonary fibrosis presenting with recent history of
frequent falls and generalized weakness.

EXAM:
CT CHEST WITHOUT CONTRAST
TECHNIQUE: Multidetector CT imaging of the chest was performed following the
standard protocol without IV contrast.

[Series 2: routine chest wo · axial · 0.64mm/px · z∈[-654,-424]mm · 13 of 54 slices shown, 15 images]
[im 4/54  soft-tissue]
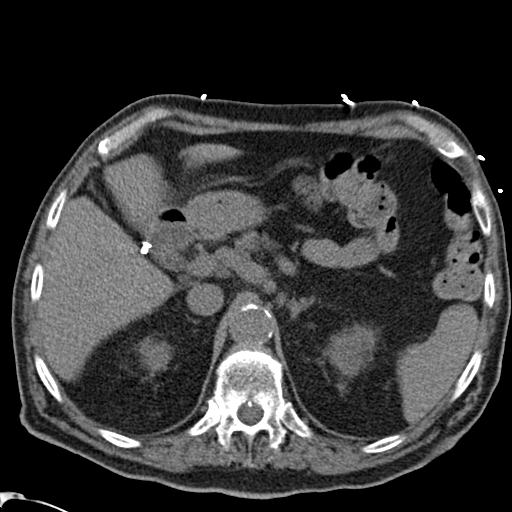
[im 4/54  bone]
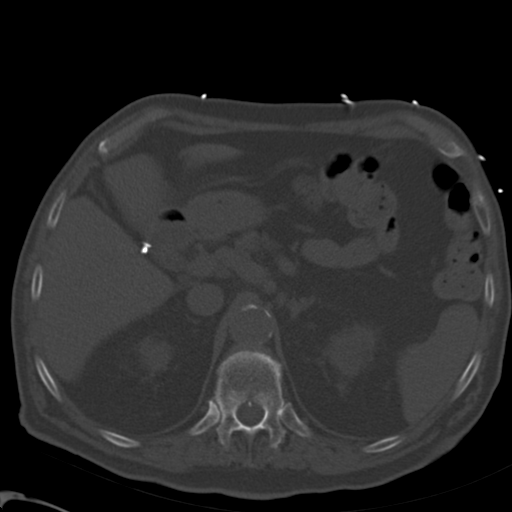
[im 7/54  soft-tissue]
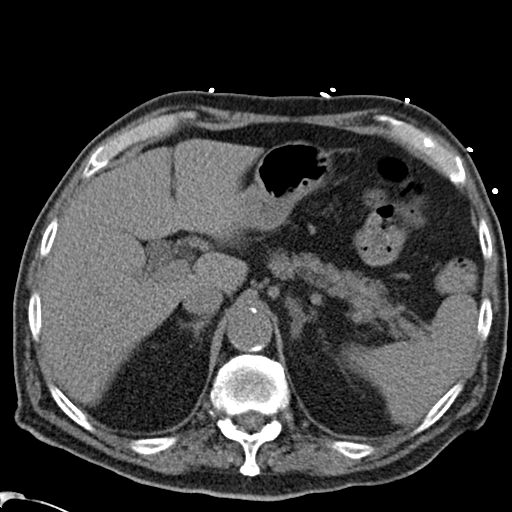
[im 11/54  soft-tissue]
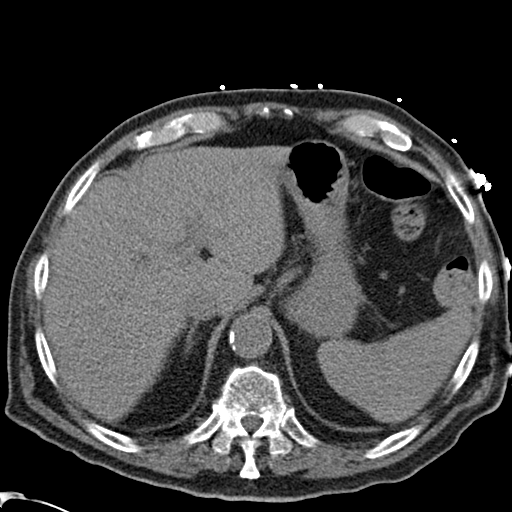
[im 16/54  soft-tissue]
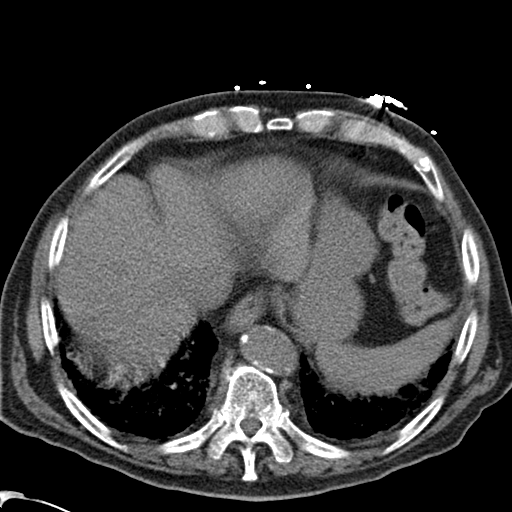
[im 19/54  soft-tissue]
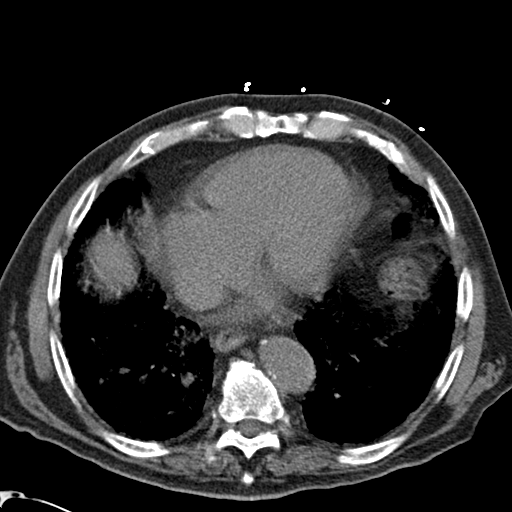
[im 23/54  soft-tissue]
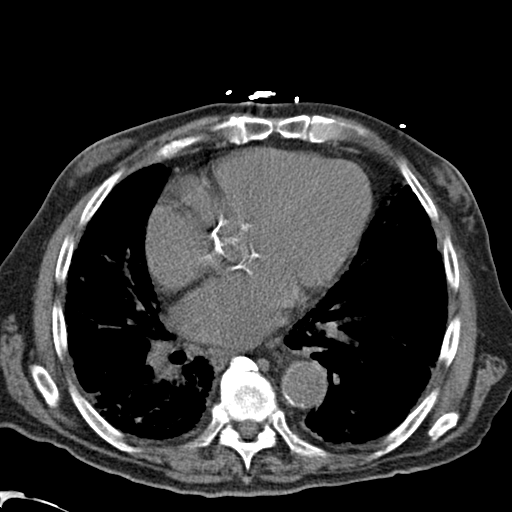
[im 28/54  soft-tissue]
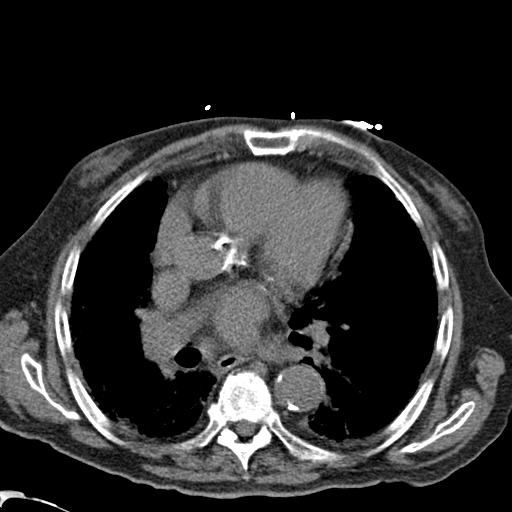
[im 31/54  soft-tissue]
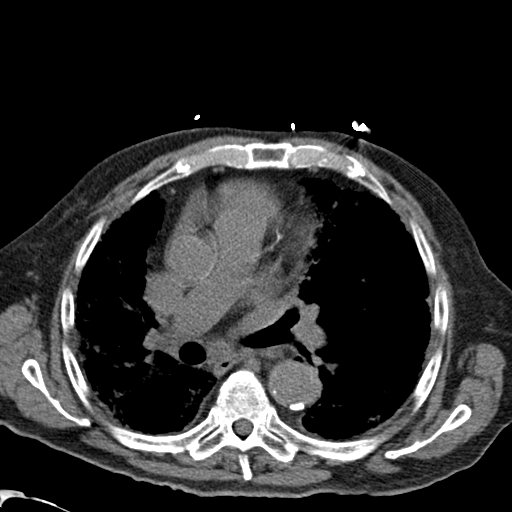
[im 35/54  soft-tissue]
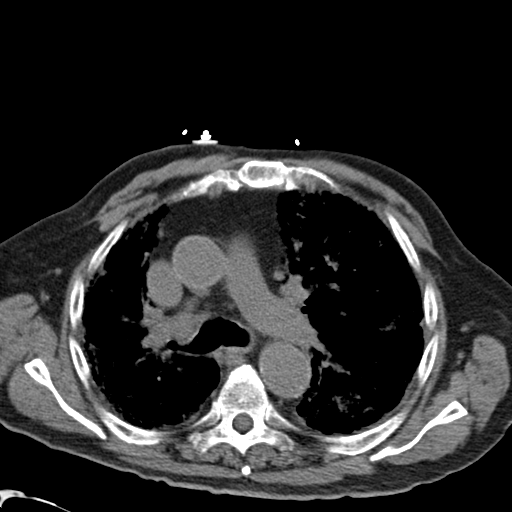
[im 35/54  bone]
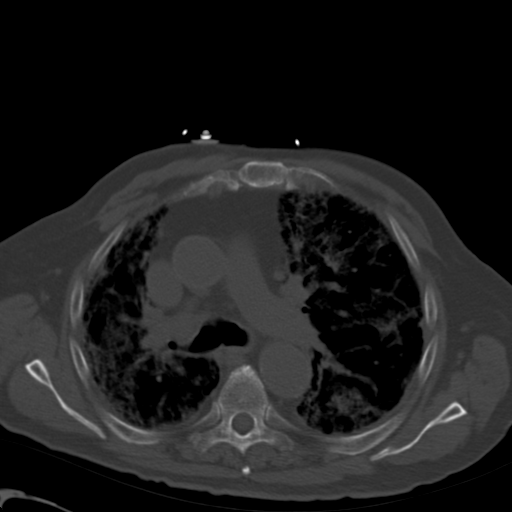
[im 38/54  soft-tissue]
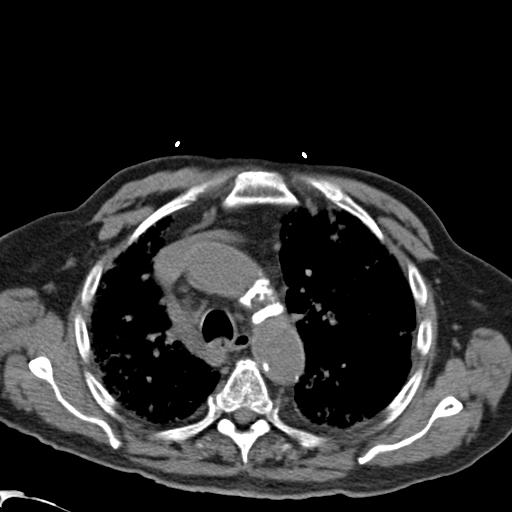
[im 43/54  soft-tissue]
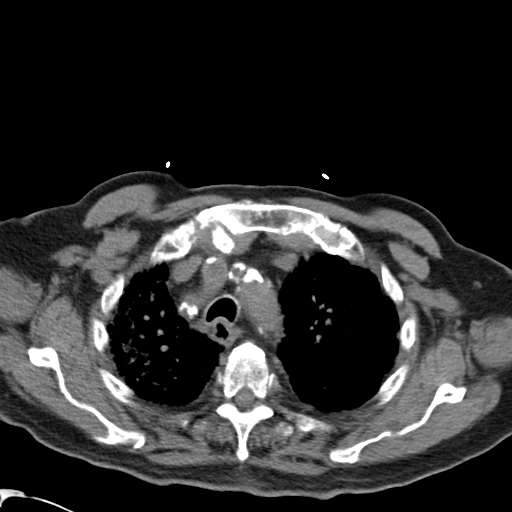
[im 47/54  soft-tissue]
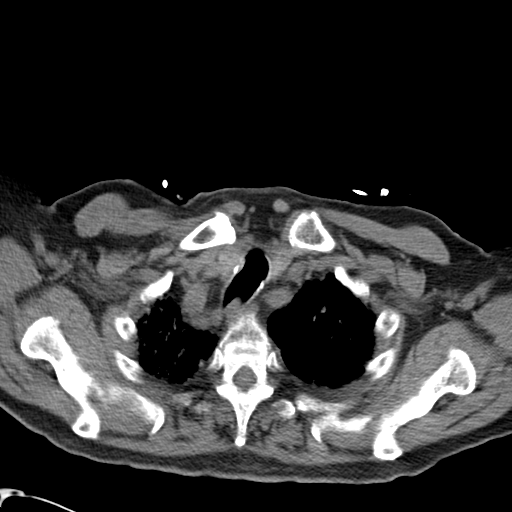
[im 50/54  soft-tissue]
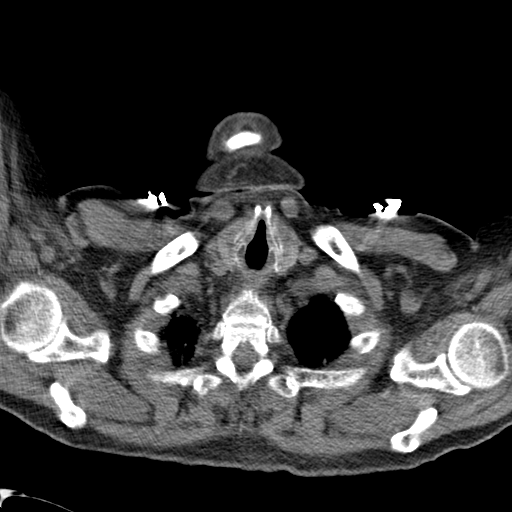

[Series 5: routine chest wo cor · coronal · 0.58mm/px · 3 of 128 slices shown]
[im 43/128  soft-tissue]
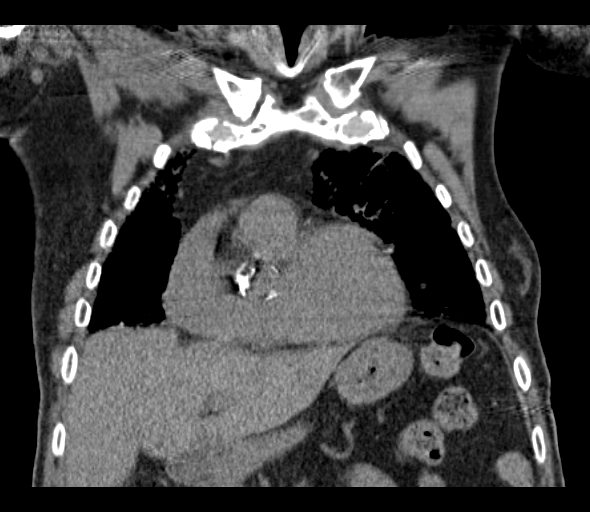
[im 57/128  soft-tissue]
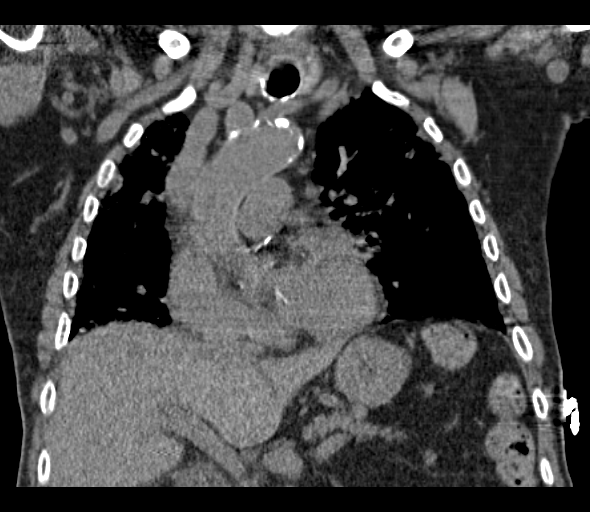
[im 71/128  soft-tissue]
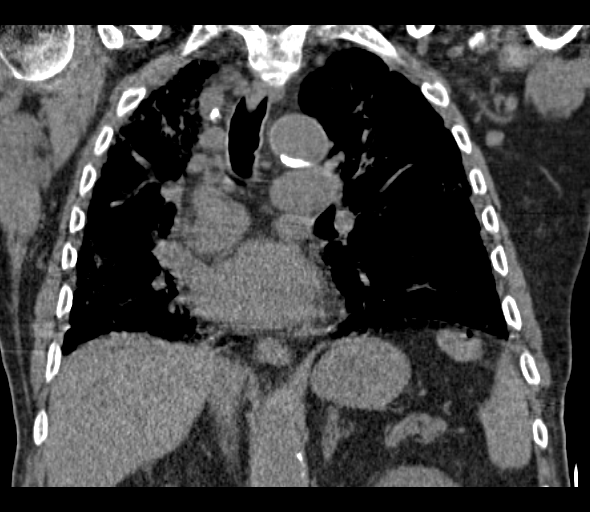

[16 of 46 positions shown; findings below may reference images not displayed]

FINDINGS: Mediastinum/Nodes: Heart size is mildly enlarged. There is no
significant pericardial fluid, thickening or pericardial
calcification. There is aortic atherosclerosis, as well as
atherosclerosis of the great vessels of the mediastinum and the
coronary arteries, including calcified atherosclerotic plaque in the
left main, left anterior descending, left circumflex and right
coronary arteries. Calcifications of the aortic valve. No
pathologically enlarged mediastinal or hilar lymph nodes. Please
note that accurate exclusion of hilar adenopathy is limited on
noncontrast CT scans. Esophagus is unremarkable in appearance. No
axillary lymphadenopathy.

Lungs/Pleura: Multifocal patchy areas of ground-glass attenuation
and septal thickening scattered throughout the lungs bilaterally
(right greater than left). There are also areas with extensive
thickening of the peribronchovascular interstitium, with some
associated cylindrical and mild varicose bronchiectasis. The areas
of greatest involvement in the lungs demonstrate mild honeycombing.
No definite craniocaudal gradient identified. Scattered areas of
mild air trapping. Scattered thick-walled cystic areas, most evident
in the lung bases. No pleural effusions.

Upper Abdomen: Status post cholecystectomy.  Aortic atherosclerosis.

Musculoskeletal: There are no aggressive appearing lytic or blastic
lesions noted in the visualized portions of the skeleton.
IMPRESSION: 1. There is a spectrum of findings in the lungs compatible with
underlying interstitial lung disease, and given the overall
appearance and lack of clear cut craniocaudal gradient, these
findings are favored to reflect chronic hypersensitivity
pneumonitis. Repeat high-resolution chest CT is suggested in 6-12
months to assess for further temporal changes in the appearance of
the lung parenchyma if clinically appropriate.
2. Mild cardiomegaly.
3. There are calcifications of the aortic valve. Echocardiographic
correlation for evaluation of potential valvular dysfunction may be
warranted if clinically indicated.
4. Aortic atherosclerosis, in addition to left main and 3 vessel
coronary artery disease.

## 2017-06-13 IMAGING — CT CT HEAD W/O CM
3 series · 16 of 47 positions shown, 19 images · non-contrast
Comparison: None.

CLINICAL DATA: Fall

EXAM:
CT HEAD WITHOUT CONTRAST
TECHNIQUE: Contiguous axial images were obtained from the base of the skull
through the vertex without intravenous contrast.

[Series 2: head wo · axial · 0.39mm/px · z∈[-154,-28]mm · 10 of 30 slices shown, 13 images]
[im 3/30  brain]
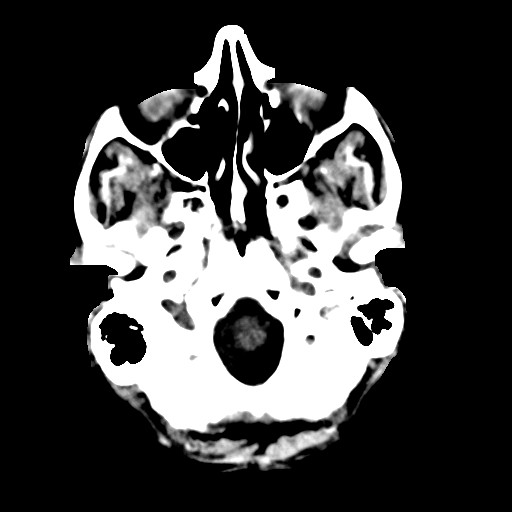
[im 3/30  bone]
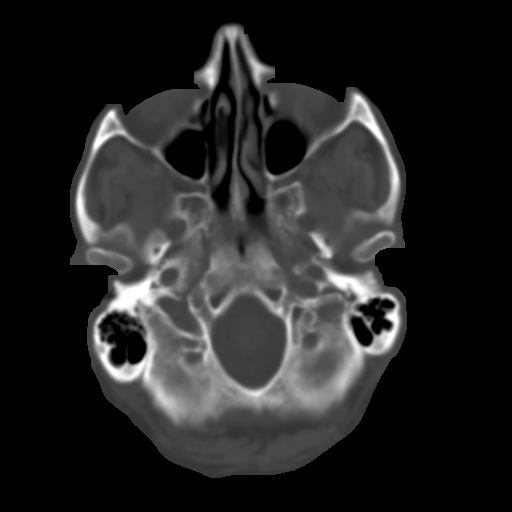
[im 6/30  brain]
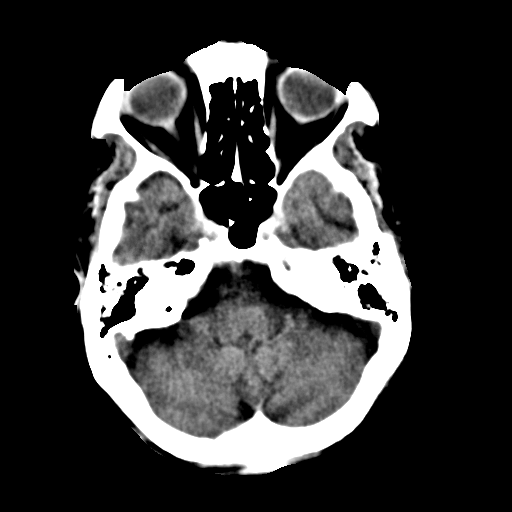
[im 9/30  brain]
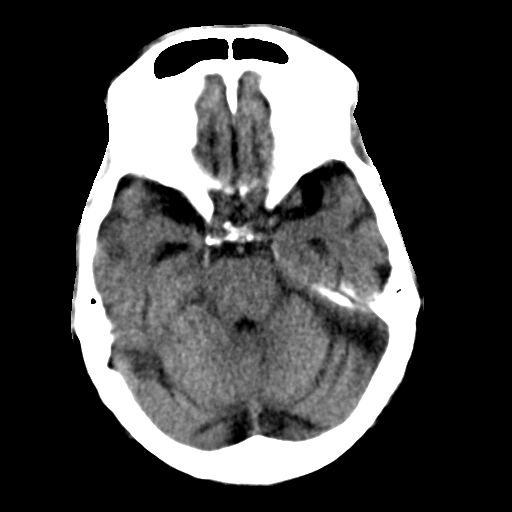
[im 11/30  brain]
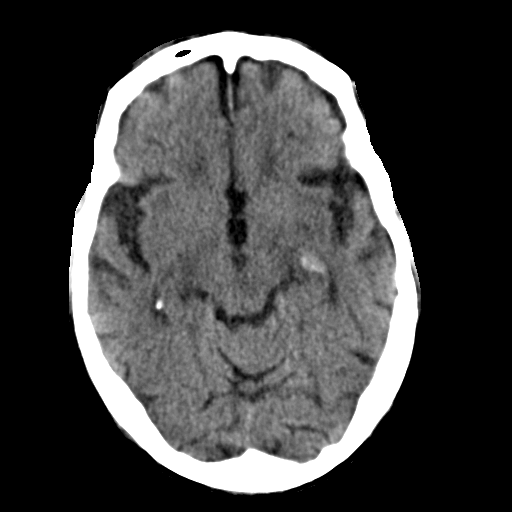
[im 14/30  brain]
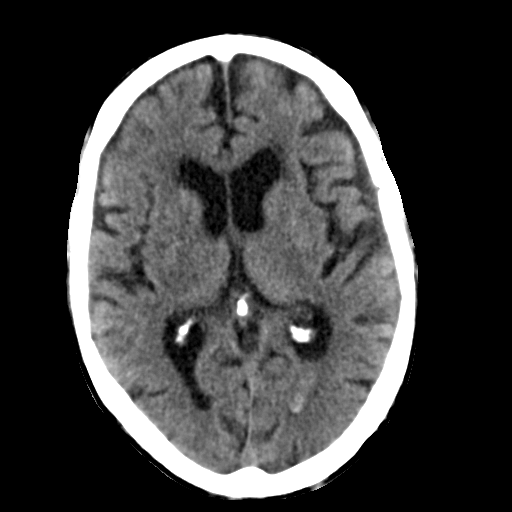
[im 14/30  bone]
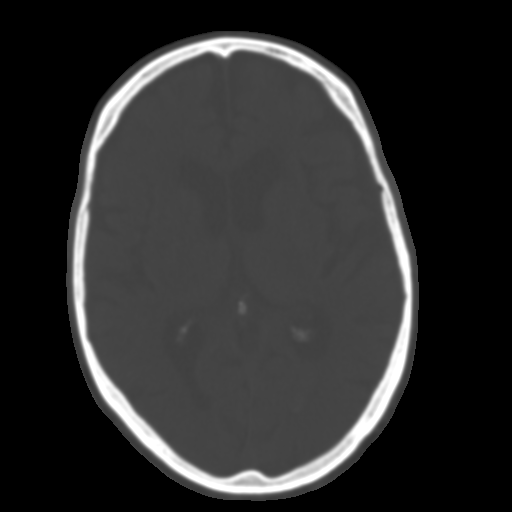
[im 17/30  brain]
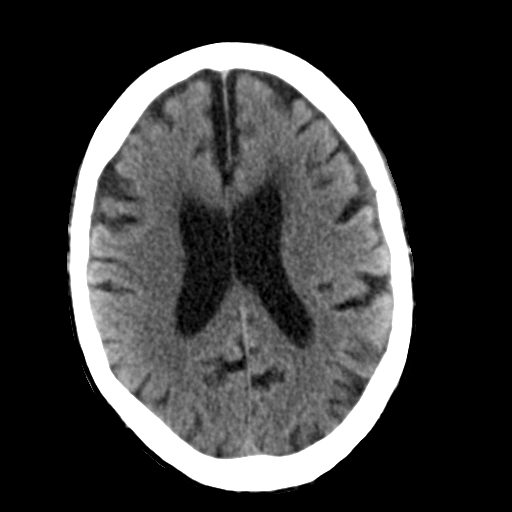
[im 20/30  brain]
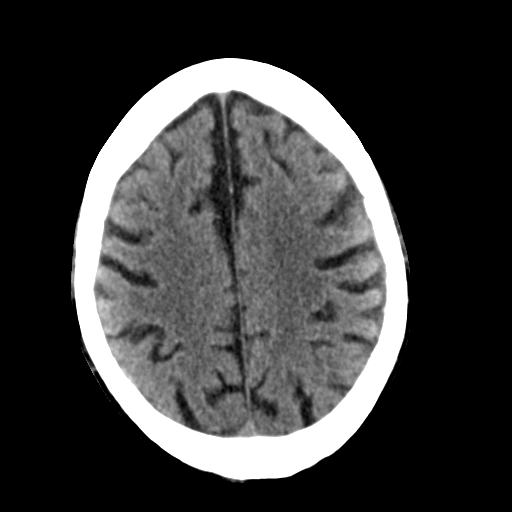
[im 23/30  brain]
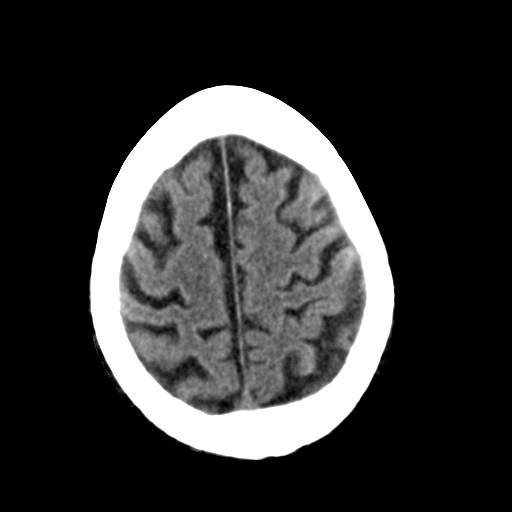
[im 25/30  brain]
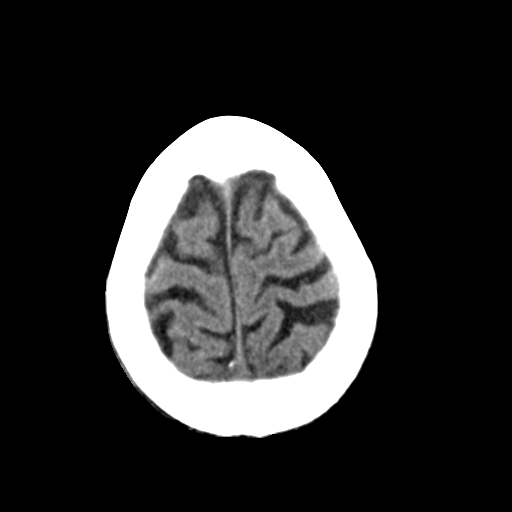
[im 25/30  bone]
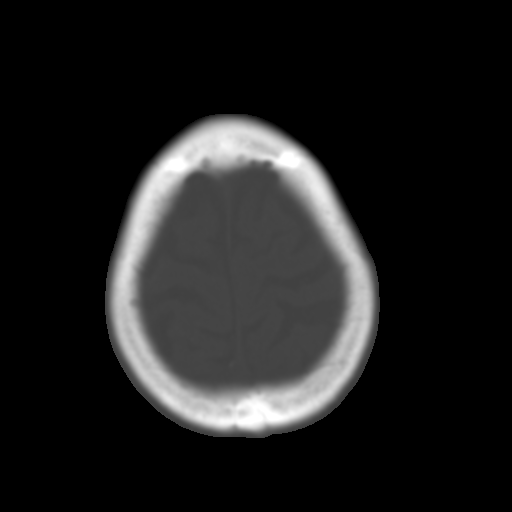
[im 28/30  brain]
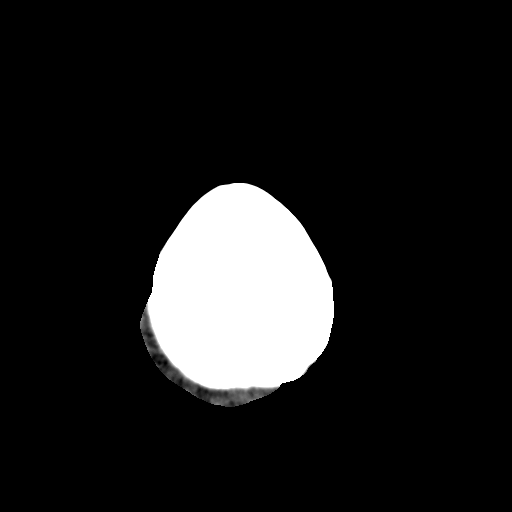

[Series 4: coronal soft tissue · coronal · 0.30mm/px · 3 of 64 slices shown]
[im 22/64  brain]
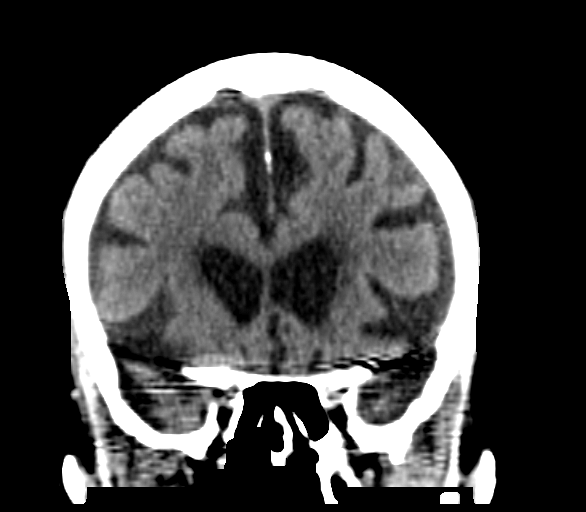
[im 29/64  brain]
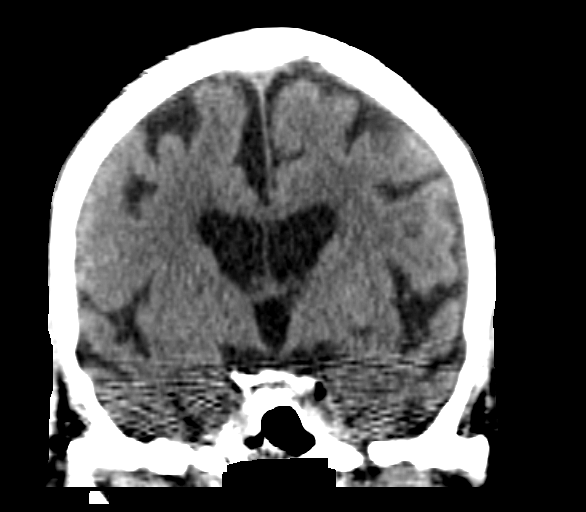
[im 36/64  brain]
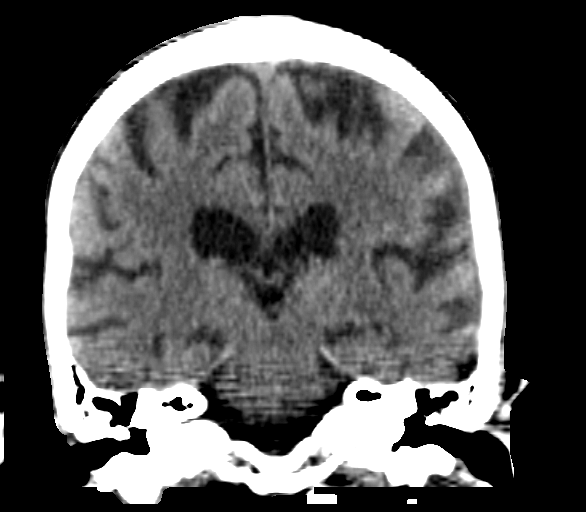

[Series 5: sagittal soft tissue · sagittal · 0.32mm/px · 3 of 47 slices shown]
[im 16/47  brain]
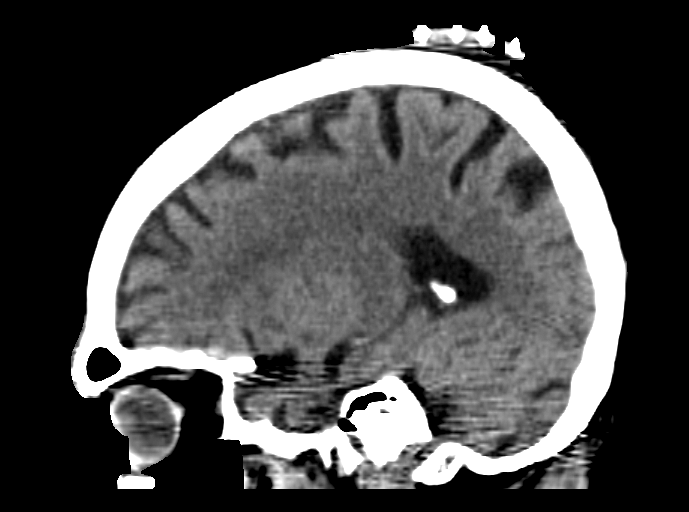
[im 24/47  brain]
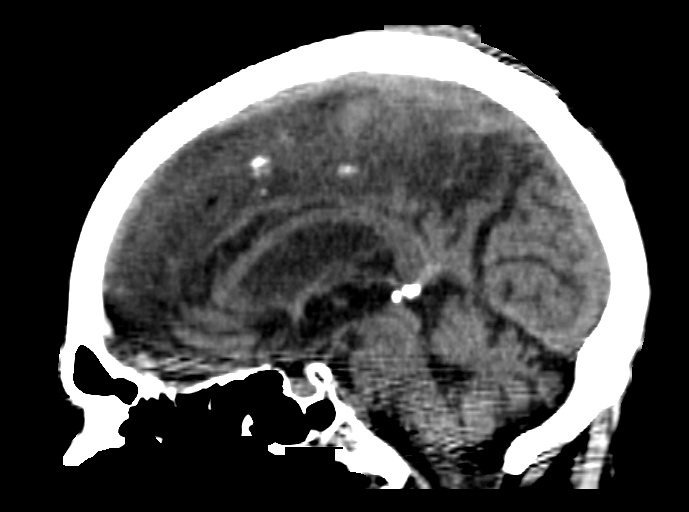
[im 31/47  brain]
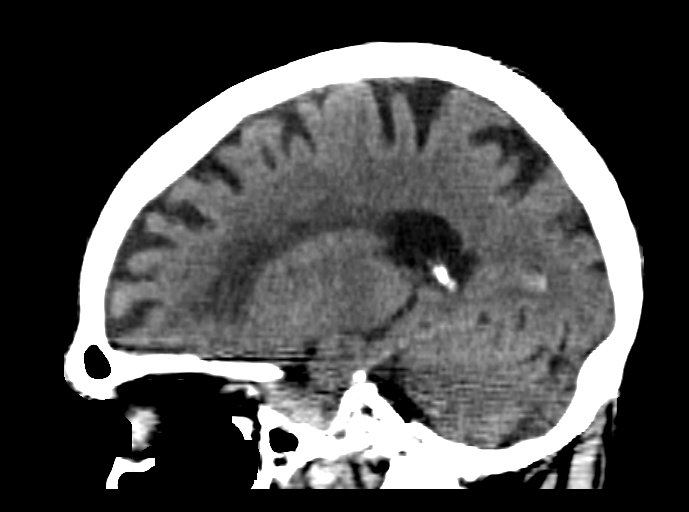

[16 of 47 positions shown; findings below may reference images not displayed]

FINDINGS: No mass effect, midline shift, or acute hemorrhage. Mild global
atrophy appropriate to age is present. Mild chronic ischemic changes
in the periventricular white matter. No skull fracture. Soft tissue
injury over the right parietal region is noted.
IMPRESSION: No acute intracranial pathology.
